# Patient Record
Sex: Female | Born: 1970 | Race: White | Hispanic: No | Marital: Married | Smoking: Never smoker
Health system: Southern US, Community
[De-identification: ages and names within clinical notes are randomized; demographics above are authoritative.]

## PROBLEM LIST (undated history)

## (undated) DIAGNOSIS — S060X9A Concussion with loss of consciousness of unspecified duration, initial encounter: Secondary | ICD-10-CM

## (undated) DIAGNOSIS — E039 Hypothyroidism, unspecified: Secondary | ICD-10-CM

## (undated) DIAGNOSIS — J449 Chronic obstructive pulmonary disease, unspecified: Secondary | ICD-10-CM

## (undated) DIAGNOSIS — F419 Anxiety disorder, unspecified: Secondary | ICD-10-CM

## (undated) DIAGNOSIS — Z8619 Personal history of other infectious and parasitic diseases: Secondary | ICD-10-CM

## (undated) DIAGNOSIS — M1990 Unspecified osteoarthritis, unspecified site: Secondary | ICD-10-CM

## (undated) DIAGNOSIS — R55 Syncope and collapse: Secondary | ICD-10-CM

## (undated) DIAGNOSIS — E559 Vitamin D deficiency, unspecified: Secondary | ICD-10-CM

## (undated) DIAGNOSIS — E663 Overweight: Secondary | ICD-10-CM

## (undated) DIAGNOSIS — R05 Cough: Secondary | ICD-10-CM

## (undated) DIAGNOSIS — E538 Deficiency of other specified B group vitamins: Secondary | ICD-10-CM

## (undated) DIAGNOSIS — J45909 Unspecified asthma, uncomplicated: Secondary | ICD-10-CM

## (undated) HISTORY — DX: Chronic obstructive pulmonary disease, unspecified: J44.9

## (undated) HISTORY — DX: Hypothyroidism, unspecified: E03.9

## (undated) HISTORY — PX: COSMETIC SURGERY: SHX468

## (undated) HISTORY — DX: Syncope and collapse: R55

## (undated) HISTORY — PX: JOINT REPLACEMENT: SHX530

## (undated) HISTORY — DX: Personal history of other infectious and parasitic diseases: Z86.19

## (undated) HISTORY — DX: Concussion with loss of consciousness of unspecified duration, initial encounter: S06.0X9A

## (undated) HISTORY — DX: Unspecified osteoarthritis, unspecified site: M19.90

## (undated) HISTORY — DX: Deficiency of other specified B group vitamins: E53.8

## (undated) HISTORY — DX: Cough: R05

## (undated) HISTORY — DX: Anxiety disorder, unspecified: F41.9

## (undated) HISTORY — DX: Vitamin D deficiency, unspecified: E55.9

## (undated) HISTORY — DX: Unspecified asthma, uncomplicated: J45.909

## (undated) HISTORY — DX: Overweight: E66.3

---

## 1998-05-11 ENCOUNTER — Ambulatory Visit (HOSPITAL_BASED_OUTPATIENT_CLINIC_OR_DEPARTMENT_OTHER): Admission: RE | Admit: 1998-05-11 | Discharge: 1998-05-11 | Payer: Self-pay | Admitting: General Surgery

## 1998-10-31 ENCOUNTER — Inpatient Hospital Stay (HOSPITAL_COMMUNITY): Admission: AD | Admit: 1998-10-31 | Discharge: 1998-10-31 | Payer: Self-pay | Admitting: Obstetrics and Gynecology

## 1998-11-02 ENCOUNTER — Ambulatory Visit (HOSPITAL_COMMUNITY): Admission: RE | Admit: 1998-11-02 | Discharge: 1998-11-02 | Payer: Self-pay | Admitting: Obstetrics & Gynecology

## 1998-11-17 HISTORY — PX: RHINOPLASTY: SUR1284

## 2000-10-13 ENCOUNTER — Other Ambulatory Visit: Admission: RE | Admit: 2000-10-13 | Discharge: 2000-10-13 | Payer: Self-pay | Admitting: Obstetrics & Gynecology

## 2000-11-17 HISTORY — PX: BREAST BIOPSY: SHX20

## 2001-11-17 DIAGNOSIS — R55 Syncope and collapse: Secondary | ICD-10-CM

## 2001-11-17 HISTORY — DX: Syncope and collapse: R55

## 2002-08-09 ENCOUNTER — Other Ambulatory Visit: Admission: RE | Admit: 2002-08-09 | Discharge: 2002-08-09 | Payer: Self-pay | Admitting: Obstetrics & Gynecology

## 2003-06-20 ENCOUNTER — Ambulatory Visit (HOSPITAL_COMMUNITY): Admission: RE | Admit: 2003-06-20 | Discharge: 2003-06-20 | Payer: Self-pay | Admitting: Obstetrics & Gynecology

## 2003-08-24 ENCOUNTER — Encounter: Payer: Self-pay | Admitting: Obstetrics and Gynecology

## 2003-08-24 ENCOUNTER — Ambulatory Visit (HOSPITAL_COMMUNITY): Admission: RE | Admit: 2003-08-24 | Discharge: 2003-08-24 | Payer: Self-pay | Admitting: Obstetrics and Gynecology

## 2003-08-29 ENCOUNTER — Inpatient Hospital Stay (HOSPITAL_COMMUNITY): Admission: AD | Admit: 2003-08-29 | Discharge: 2003-08-31 | Payer: Self-pay | Admitting: Obstetrics and Gynecology

## 2003-10-27 ENCOUNTER — Other Ambulatory Visit: Admission: RE | Admit: 2003-10-27 | Discharge: 2003-10-27 | Payer: Self-pay | Admitting: Obstetrics & Gynecology

## 2004-01-08 ENCOUNTER — Ambulatory Visit (HOSPITAL_COMMUNITY): Admission: RE | Admit: 2004-01-08 | Discharge: 2004-01-08 | Payer: Self-pay | Admitting: Family Medicine

## 2006-02-11 ENCOUNTER — Other Ambulatory Visit: Admission: RE | Admit: 2006-02-11 | Discharge: 2006-02-11 | Payer: Self-pay | Admitting: Obstetrics & Gynecology

## 2010-10-02 ENCOUNTER — Encounter: Admission: RE | Admit: 2010-10-02 | Discharge: 2010-10-02 | Payer: Self-pay | Admitting: Obstetrics & Gynecology

## 2010-11-17 DIAGNOSIS — R053 Chronic cough: Secondary | ICD-10-CM

## 2010-11-17 HISTORY — DX: Chronic cough: R05.3

## 2011-09-01 ENCOUNTER — Encounter: Payer: Self-pay | Admitting: Family Medicine

## 2011-09-01 ENCOUNTER — Ambulatory Visit (INDEPENDENT_AMBULATORY_CARE_PROVIDER_SITE_OTHER): Payer: Commercial Managed Care - PPO | Admitting: Family Medicine

## 2011-09-01 VITALS — BP 122/84 | HR 78 | Temp 98.5°F | Ht 66.0 in | Wt 181.2 lb

## 2011-09-01 DIAGNOSIS — E669 Obesity, unspecified: Secondary | ICD-10-CM | POA: Insufficient documentation

## 2011-09-01 DIAGNOSIS — E663 Overweight: Secondary | ICD-10-CM | POA: Insufficient documentation

## 2011-09-01 DIAGNOSIS — Z23 Encounter for immunization: Secondary | ICD-10-CM

## 2011-09-01 DIAGNOSIS — R05 Cough: Secondary | ICD-10-CM

## 2011-09-01 DIAGNOSIS — R635 Abnormal weight gain: Secondary | ICD-10-CM

## 2011-09-01 MED ORDER — PREDNISONE 20 MG PO TABS: ORAL_TABLET | ORAL | Status: DC

## 2011-09-01 MED ORDER — HYDROCODONE-HOMATROPINE 5-1.5 MG/5ML PO SYRP: 5.0000 mL | ORAL_SOLUTION | Freq: Every evening | ORAL | Status: AC | PRN

## 2011-09-01 MED ORDER — ALBUTEROL 90 MCG/ACT IN AERS: 2.0000 | INHALATION_SPRAY | Freq: Four times a day (QID) | RESPIRATORY_TRACT | Status: DC | PRN

## 2011-09-01 NOTE — Progress Notes (Signed)
Subjective:    Patient ID: Lindsey Proctor, female    DOB: Jun 15, 1971, 40 y.o.   MRN: [id number]  HPI CC: new pt, establish  Wanted new primary care doc for insurance purposes.  Previously seen at urgent medical (prior saw [facility] Gilbert [facility] in [place]).  Persistent cough - once a year gets acute bronchitis that lasts >1 month.  Has been going on for 8 wks.  This started as head cold.  Has come earlier this year, usually tends to come in winter.  In past given albuterol treatment, shot of prednisone (which helps within minutes), given inhaler to take home.  This year cough started, some different symptoms.  Has completely lost sense of taste.  Throat feels swollen and neck muscles tight - cold compresses help.  SOB different this time, comes on with cough, inhaler (albuterol) not really helping.  Cough worse at night.  Dry cough.  Had clear CXR last year.  Cough syrup does help (hydrocodone).  No asthma dx.  No RN, head congestion, fevers/chills, abd pain, n/v, rashes. No h/o heartburn, GERD sxs.  No leg swelling, PNdyspnea.  No chest pain. Notes sensitive to smells like tar - trouble breathing.  Recently increased stress last 6 mo - change in jobs (lost prior job).  Has peak flow at home.  Has never had PFTs done.  In last 6 mo gained 25lbs.  Preventative: Tdap 05/2011 No flu shot yet.  Would like today. Last CPE 09/2010, with blood work.  Well woman at [facility], always normal pap smears.  No recent thyroid check. mammo 2011.  Medications and allergies reviewed and updated in chart.  Past histories reviewed and updated if relevant as below. There is no problem list on file for this patient.  Past Medical History  Diagnosis Date  . Fainting spell 2003  . History of chicken pox    Past Surgical History  Procedure Date  . Rhinoplasty 2000    for deviated septum  . Breast biopsy 2002   History  Substance Use Topics  . Smoking status: Never Smoker     . Smokeless tobacco: Never Used  . Alcohol Use: No   Family History  Problem Relation Age of Onset  . Diabetes Father   . Coronary artery disease Neg Hx   . Cancer Neg Hx   . Stroke Neg Hx   . Hypertension Neg Hx    No Known Allergies Current Outpatient Prescriptions on File Prior to Visit  Medication Sig Dispense Refill  . levonorgestrel (MIRENA) 20 MCG/24HR IUD 1 each by Intrauterine route once.         Review of Systems  Constitutional: Positive for unexpected weight change. Negative for fever, chills, activity change, appetite change and fatigue.  HENT: Negative for hearing loss and neck pain.   Eyes: Negative for visual disturbance.  Respiratory: Positive for cough and shortness of breath. Negative for chest tightness and wheezing.   Cardiovascular: Negative for chest pain, palpitations and leg swelling.  Gastrointestinal: Negative for nausea, vomiting, abdominal pain, diarrhea, constipation, blood in stool and abdominal distention.  Genitourinary: Negative for hematuria and difficulty urinating.  Musculoskeletal: Negative for myalgias and arthralgias.  Skin: Negative for rash.  Neurological: Negative for dizziness, seizures, syncope and headaches.  Hematological: Does not bruise/bleed easily.  Psychiatric/Behavioral: Negative for dysphoric mood. The patient is not nervous/anxious.       Objective:   Physical Exam  Nursing note and vitals reviewed. Constitutional: She is  oriented to person, place, and time. She appears well-developed and well-nourished. No distress.  HENT:  Head: Normocephalic and atraumatic.  Right Ear: External ear normal.  Left Ear: External ear normal.  Nose: Nose normal.  Mouth/Throat: Oropharynx is clear and moist. No oropharyngeal exudate.  Eyes: Conjunctivae and EOM are normal. Pupils are equal, round, and reactive to light. No scleral icterus.  Neck: Normal range of motion. Neck supple. No thyromegaly present.  Cardiovascular: Normal rate,  regular rhythm, normal heart sounds and intact distal pulses.   No murmur heard. Pulses:      Radial pulses are 2+ on the right side, and 2+ on the left side.  Pulmonary/Chest: Effort normal and breath sounds normal. No respiratory distress. She has no wheezes. She has no rales.  Abdominal: Soft. Bowel sounds are normal. She exhibits no distension and no mass. There is no tenderness. There is no rebound and no guarding.  Musculoskeletal: Normal range of motion.  Lymphadenopathy:    She has no cervical adenopathy.  Neurological: She is alert and oriented to person, place, and time.       CN grossly intact, station and gait intact  Skin: Skin is warm and dry. No rash noted.  Psychiatric: She has a normal mood and affect. Her behavior is normal. Judgment and thought content normal.      Assessment & Plan:

## 2011-09-01 NOTE — Assessment & Plan Note (Signed)
Review records, will want to obtain TSH next blood draw. Pt to come for blood work when fasting and afterwards for CPE.

## 2011-09-01 NOTE — Assessment & Plan Note (Signed)
Anticipate RAD component, going on 2+ months.  Continue albuterol and cough syrup. No evidence of bacterial infection currently, no need for abx. Start short course of steroids as states has responded well to steroids in past for throat tightness. Given having recurrent prolonged episodes of bronchitis yearly for last several years, have asked her to return when fasting for pre and post spirometry.  Significant second hand smoke exposure when younger. Have requested records from recent [facility]. Flu shot today.

## 2011-09-01 NOTE — Patient Instructions (Addendum)
For cough - continue albuterol as needed.  Continue hydrocodone cough syrup.  I will place you on short course of steroids to see if we can help the cough. Return in 2-3 months for spirometry pre and post albuterol. Good to see you today, call us with questions. I will request records from urgent care. Return at your convenience fasting for blood works, afterwards for CPE. Flu shot today.

## 2011-09-03 ENCOUNTER — Telehealth: Payer: Self-pay | Admitting: *Deleted

## 2011-09-03 NOTE — Telephone Encounter (Signed)
Pt states hycodan was to have been called to the pharmacy at her last office visit but it wasn't.  I checked with [facility] and they didn't have it.  I called it in to them.

## 2011-09-03 NOTE — Telephone Encounter (Signed)
Thank you :)

## 2011-09-24 ENCOUNTER — Telehealth: Payer: Self-pay | Admitting: *Deleted

## 2011-09-24 NOTE — Telephone Encounter (Signed)
Pt needs to have a physical done before the end of the year so that her insurance will cover it.  She has one scheduled but you had told her that she needs to have spirometry done at that visit.  She is concerned that her insurance wont pay for that, since that wouldn't fall under a wellness screening.  She is asking if she should postpone the spirometry.  Please advise.

## 2011-09-24 NOTE — Telephone Encounter (Signed)
Ok to do physical first, then spirometry at her covenience.

## 2011-09-25 NOTE — Telephone Encounter (Signed)
Patient notified

## 2011-11-07 ENCOUNTER — Encounter: Payer: Self-pay | Admitting: Family Medicine

## 2011-11-07 ENCOUNTER — Ambulatory Visit (INDEPENDENT_AMBULATORY_CARE_PROVIDER_SITE_OTHER): Payer: Commercial Managed Care - PPO | Admitting: Family Medicine

## 2011-11-07 VITALS — BP 116/78 | HR 72 | Temp 98.0°F | Wt 182.2 lb

## 2011-11-07 DIAGNOSIS — Z0000 Encounter for general adult medical examination without abnormal findings: Secondary | ICD-10-CM | POA: Insufficient documentation

## 2011-11-07 LAB — COMPREHENSIVE METABOLIC PANEL
ALT: 20 U/L (ref 0–35)
AST: 21 U/L (ref 0–37)
Albumin: 3.9 g/dL (ref 3.5–5.2)
CO2: 25 mEq/L (ref 19–32)
Calcium: 9 mg/dL (ref 8.4–10.5)
Chloride: 108 mEq/L (ref 96–112)
Creatinine, Ser: 0.7 mg/dL (ref 0.4–1.2)
GFR: 97.91 mL/min (ref >60.00)
Potassium: 4.2 mEq/L (ref 3.5–5.1)
Total Protein: 7.1 g/dL (ref 6.0–8.3)

## 2011-11-07 LAB — CBC WITH DIFFERENTIAL/PLATELET
Basophils Absolute: 0 10*3/uL (ref 0.0–0.1)
Eosinophils Absolute: 0 10*3/uL (ref 0.0–0.7)
Lymphocytes Relative: 25.2 % (ref 12.0–46.0)
MCHC: 34 g/dL (ref 30.0–36.0)
MCV: 89.7 fl (ref 78.0–100.0)
Monocytes Absolute: 0.6 10*3/uL (ref 0.1–1.0)
Neutrophils Relative %: 64.2 % (ref 43.0–77.0)
Platelets: 181 10*3/uL (ref 150.0–400.0)
RDW: 13.4 % (ref 11.5–14.6)

## 2011-11-07 LAB — LIPID PANEL
Cholesterol: 215 mg/dL — ABNORMAL HIGH (ref 0–200)
Total CHOL/HDL Ratio: 5

## 2011-11-07 NOTE — Patient Instructions (Addendum)
Blood work today. Return at your convenience for spirometry.  Try to get most or all of your calcium from your food--aim for 1000 mg/day for women up to 50 and men up to 70 and 1200 mg/day for women over 50 and men over 70.  To figure out dietary calcium: 300 mg/day from all non dairy foods plus 300 mg per cup of milk, other dairy, or fortified juice.  Non dairy foods that contain calcium: Kale, oranges, sardines, oatmeal, soy milk/soybeans, salmon, white beans, dried figs, turnip greens, almonds, broccoli, tofu.  Increase dietary vitamin D intake - goal 600-800IU daily.  That means more fish - mackerel, tuna, salmon, herring, sardines, catfish, cod liver oil, and eggs.  And small doses of sunshine throughout the day.  Good to see you , call us with questions.

## 2011-11-07 NOTE — Progress Notes (Signed)
Subjective:    Patient ID: Lindsey Proctor, female    DOB: 1970/11/29, 40 y.o.   MRN: [id number]  HPI CC: CPE  Here for CPE today.  Yearly bronchitis that lasts 2 mo to resolve.  Significant smoke exposure growing up.  Always responsive to steroids.  Will set up with spirometry to eval copd, asthma.  No allergy sxs, RN congestion, sneezing, itchy eyes.  Wants to return for this.  Feels hot at night - over last month having occasional episodes where awakens too hot and has to throw covers off.  Endorses weight gain over last 1 year.  Decreased appetite.  New job since June 2012.  Feels normal weight is 140 lbs.  Wt Readings from Last 3 Encounters:  11/07/11 182 lb 4 oz (82.668 kg)  09/01/11 181 lb 4 oz (82.214 kg)   Preventative:  Tdap 05/2011  Flu 2012 Last CPE 09/2010, with blood work. Well woman at [facility], always normal pap smears. mammo 09/2010 with fibroid cyst.  To repeat in 09/2012. Low calcium in diet. Fasting today. Seat belt 100% time.  Medications and allergies reviewed and updated in chart.  Past histories reviewed and updated if relevant as below. Patient Active Problem List  Diagnoses  . Cough  . Weight gain   Past Medical History  Diagnosis Date  . Fainting spell 2003  . History of chicken pox    Past Surgical History  Procedure Date  . Rhinoplasty 2000    for deviated septum  . Breast biopsy 2002   History  Substance Use Topics  . Smoking status: Never Smoker   . Smokeless tobacco: Never Used  . Alcohol Use: No   Family History  Problem Relation Age of Onset  . Diabetes Father   . Coronary artery disease Neg Hx   . Cancer Neg Hx   . Stroke Neg Hx   . Hypertension Neg Hx    No Known Allergies Current Outpatient Prescriptions on File Prior to Visit  Medication Sig Dispense Refill  . acetaminophen (TYLENOL) 325 MG tablet Take 650 mg by mouth every 6 (six) hours as needed.        Marland Kitchen ibuprofen (ADVIL,MOTRIN) 200 MG tablet Take 200 mg by mouth  every 6 (six) hours as needed.        Marland Kitchen levonorgestrel (MIRENA) 20 MCG/24HR IUD 1 each by Intrauterine route once.        Marland Kitchen albuterol (PROVENTIL,VENTOLIN) 90 MCG/ACT inhaler Inhale 2 puffs into the lungs every 6 (six) hours as needed for wheezing.  17 g  3   Review of Systems  Constitutional: Negative for fever, chills, activity change, appetite change, fatigue and unexpected weight change.  HENT: Negative for hearing loss and neck pain.   Eyes: Negative for visual disturbance.  Respiratory: Negative for cough, chest tightness, shortness of breath and wheezing.   Cardiovascular: Negative for chest pain, palpitations and leg swelling.  Gastrointestinal: Negative for nausea, vomiting, abdominal pain, diarrhea, constipation, blood in stool and abdominal distention.  Genitourinary: Negative for hematuria and difficulty urinating.  Musculoskeletal: Negative for myalgias and arthralgias.  Skin: Negative for rash.  Neurological: Negative for dizziness, seizures, syncope and headaches.  Hematological: Does not bruise/bleed easily.  Psychiatric/Behavioral: Negative for dysphoric mood. The patient is not nervous/anxious.        Objective:   Physical Exam  Nursing note and vitals reviewed. Constitutional: She is oriented to person, place, and time. She appears well-developed and well-nourished. No distress.  HENT:  Head: Normocephalic  and atraumatic.  Right Ear: External ear normal.  Left Ear: External ear normal.  Nose: Nose normal.  Mouth/Throat: Oropharynx is clear and moist. No oropharyngeal exudate.  Eyes: Conjunctivae and EOM are normal. Pupils are equal, round, and reactive to light. No scleral icterus.  Neck: Normal range of motion. Neck supple. No thyromegaly present.  Cardiovascular: Normal rate, regular rhythm, normal heart sounds and intact distal pulses.   No murmur heard. Pulses:      Radial pulses are 2+ on the right side, and 2+ on the left side.  Pulmonary/Chest: Effort  normal and breath sounds normal. No respiratory distress. She has no wheezes. She has no rales.  Abdominal: Soft. Bowel sounds are normal. She exhibits no distension and no mass. There is no tenderness. There is no rebound and no guarding.  Musculoskeletal: Normal range of motion.  Lymphadenopathy:    She has no cervical adenopathy.  Neurological: She is alert and oriented to person, place, and time.       CN grossly intact, station and gait intact  Skin: Skin is warm and dry. No rash noted.  Psychiatric: She has a normal mood and affect. Her behavior is normal. Judgment and thought content normal.       Assessment & Plan:

## 2011-11-07 NOTE — Assessment & Plan Note (Signed)
Preventative protocols reviewed and updated. UTD tetanus, flu, pap, mammo.  Due for rpt mammo 09/2012. Well woman at [facility].  Fasting blood work today.

## 2012-01-28 ENCOUNTER — Encounter: Payer: Self-pay | Admitting: Family Medicine

## 2012-01-28 ENCOUNTER — Ambulatory Visit (INDEPENDENT_AMBULATORY_CARE_PROVIDER_SITE_OTHER): Payer: Commercial Managed Care - PPO | Admitting: Family Medicine

## 2012-01-28 VITALS — BP 110/78 | HR 76 | Temp 98.4°F | Ht 64.0 in | Wt 173.2 lb

## 2012-01-28 DIAGNOSIS — R05 Cough: Secondary | ICD-10-CM

## 2012-01-28 DIAGNOSIS — R059 Cough, unspecified: Secondary | ICD-10-CM

## 2012-01-28 MED ORDER — ALBUTEROL SULFATE (2.5 MG/3ML) 0.083% IN NEBU
2.5000 mg | INHALATION_SOLUTION | Freq: Once | RESPIRATORY_TRACT | Status: AC
  Administered 2012-01-28: 2.5 mg via RESPIRATORY_TRACT

## 2012-01-28 NOTE — Progress Notes (Signed)
  Subjective:    Patient ID: Lindsey Proctor, female    DOB: October 31, 1971, 41 y.o.   MRN: [id number]  HPI CC: f/u cough  Cough resolved.  Lasted 8 wks, finally subsided in December.  Has done well for last 3 months.    Has not had PFTs or CXR in past.  Once or twice yearly bronchitis that lasts 2 mo to resolve (in winter and again in spring). Significant smoke exposure growing up. Always responsive to steroids. Will set up with spirometry to eval copd, asthma (including cough-variant).   Denies fevers/chills, RN, sneezing, indigestion, heartburn, PNDrainage, watery eyes.  Husband and daughter with head colds recently, pt worried cough may return.  Medications and allergies reviewed and updated in chart.  Past histories reviewed and updated if relevant as below. Patient Active Problem List  Diagnoses  . Cough  . Weight gain  . Healthcare maintenance   Past Medical History  Diagnosis Date  . Fainting spell 2003  . History of chicken pox    Past Surgical History  Procedure Date  . Rhinoplasty 2000    for deviated septum  . Breast biopsy 2002   History  Substance Use Topics  . Smoking status: Never Smoker   . Smokeless tobacco: Never Used  . Alcohol Use: No   Family History  Problem Relation Age of Onset  . Diabetes Father   . Coronary artery disease Neg Hx   . Cancer Neg Hx   . Stroke Neg Hx   . Hypertension Neg Hx    No Known Allergies Current Outpatient Prescriptions on File Prior to Visit  Medication Sig Dispense Refill  . acetaminophen (TYLENOL) 325 MG tablet Take 650 mg by mouth every 6 (six) hours as needed.        Marland Kitchen ibuprofen (ADVIL,MOTRIN) 200 MG tablet Take 200 mg by mouth every 6 (six) hours as needed.        Marland Kitchen levonorgestrel (MIRENA) 20 MCG/24HR IUD 1 each by Intrauterine route once.        Marland Kitchen albuterol (PROVENTIL,VENTOLIN) 90 MCG/ACT inhaler Inhale 2 puffs into the lungs every 6 (six) hours as needed for wheezing.  17 g  3    Body mass index is 29.74  kg/(m^2).  Review of Systems Per HPI    Objective:   Physical Exam  Nursing note and vitals reviewed. Constitutional: She appears well-developed and well-nourished. No distress.  HENT:  Head: Normocephalic and atraumatic.  Mouth/Throat: Oropharynx is clear and moist. No oropharyngeal exudate.  Eyes: Conjunctivae and EOM are normal. Pupils are equal, round, and reactive to light. No scleral icterus.  Neck: Normal range of motion. Neck supple.  Cardiovascular: Normal rate, regular rhythm, normal heart sounds and intact distal pulses.   No murmur heard. Pulmonary/Chest: Effort normal and breath sounds normal. No respiratory distress. She has no wheezes. She has no rales.       Slight decreased breath sounds bibasilarly  Musculoskeletal: She exhibits no edema.  Skin: Skin is warm and dry. No rash noted.  Psychiatric: She has a normal mood and affect.       Assessment & Plan:

## 2012-01-28 NOTE — Assessment & Plan Note (Addendum)
Previously thought RAD component as improved with steroids.   Significant second hand smoke exposure when younger. Check spirometry pre/post today --> normal.  5% increase in FEV1 with albuterol. Predicted FEV1 = 2.94.   Predicted PEF = ~400 for age and height Monitor for return of bronchitis.

## 2012-01-28 NOTE — Patient Instructions (Addendum)
Good to see you today.  Call us with questions. Spirometry showing great lung function!  (pointing against significant asthma or any COPD component) No changes needed today - continue albuterol as needed.

## 2012-04-29 ENCOUNTER — Telehealth: Payer: Self-pay

## 2012-04-29 NOTE — Telephone Encounter (Signed)
Patient notified and sample placed up front for pick up.

## 2012-04-29 NOTE — Telephone Encounter (Signed)
Pt has hx of cough that last several weeks; pt said Dr Sharen Hones wanted to know at onset of next cough episode; began 04/26/12 with non productive cough, 99 temp on 04/26/12, no fever since, no wheezing, no trouble breathing; pt using inhaler. No S/T, head congestion or runny nose. Pt request med to avoid several weeks of coughing.Please advise.[facility].

## 2012-04-29 NOTE — Telephone Encounter (Signed)
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  I'd like [place] to try inhaled corticosteroids.  May come in to pick up flovent 2 puffs bid.  Do this daily for next week and update me with progress of cough.

## 2012-04-29 NOTE — Telephone Encounter (Signed)
Message left for patient to call me back to advise how to use sample meds.

## 2012-05-03 ENCOUNTER — Ambulatory Visit (INDEPENDENT_AMBULATORY_CARE_PROVIDER_SITE_OTHER): Payer: Commercial Managed Care - PPO | Admitting: Family Medicine

## 2012-05-03 ENCOUNTER — Telehealth: Payer: Self-pay | Admitting: Family Medicine

## 2012-05-03 ENCOUNTER — Encounter: Payer: Self-pay | Admitting: Family Medicine

## 2012-05-03 VITALS — BP 112/80 | HR 89 | Temp 98.6°F | Wt 173.8 lb

## 2012-05-03 DIAGNOSIS — R05 Cough: Secondary | ICD-10-CM

## 2012-05-03 MED ORDER — AMOXICILLIN 875 MG PO TABS: 875.0000 mg | ORAL_TABLET | Freq: Two times a day (BID) | ORAL | Status: AC

## 2012-05-03 NOTE — Telephone Encounter (Signed)
See if she can be here today at 4:30.  If not, then go to UC or get on schedule tomorrow.  Thanks.

## 2012-05-03 NOTE — Telephone Encounter (Signed)
Caller: Kip/Patient; PCP: Eustaquio Boyden;  Call regarding Persistant nonproductive  Cough- possibly triggered by Allergies/Sinus- lastest flair up onset 04/26/12. She had low grade fever for a few days when it started and daughter sick. Spoke with Selena Batten, RN in the office on 04/29/12 and has been using Flovent 2 puffs twice daily since 04/30/12 and not really helping reduce cough.  Uses Albuterol Inhaler prn. She takes Hydrocodone at night but is still up at night coughing. Seen in UC in the past for same cough and chest Xray negative for Bronchitis ans Pneumonia. Treated with Prednisone and Inhaler back in Nov 2012- didnt really help. She is getting flairup for 6 weeks at a time- mainly in fall. Feels drainage in throat and since on Flovent she has coughed up small amount whitish mucos. She has coughing spasms to the point of vomiting and sides hurt/chest burns. Nose bleeds with blowing. She got sent home from work today d/t cough-05/03/12. Triage and Care advice per Cough Protocol and APPNT ADVISED WITHIN 4 HOURS. PLEASE CALL BACK TO LET HER KNOW IF SHE CAN BE SEEN IN OFFICE TODAY- OR IF SHE SHOULD GO TO UC OR WAIT TO BE SEEN ON 05/04/12. CB#: [phone]

## 2012-05-03 NOTE — Progress Notes (Signed)
Cough started last week.  She had an episode last fall that lasted 6 weeks.  Deep breath triggers the cough.  Small amount of white sputum.  Some post nasal gtt.  Voice changed, was hoarse recently, some better now.  Recently started on flovent; SABA doesn't help much with the cough.  She'll have coughing fits and will eventually vomit/dry heave.  No fevers, tmax 99 last week.  No h/o asthma.  Nonsmoker.  Normal spirometry recently done.  Hydrocodone wasn't helping the cough much.  Tessalon doesn't help.  Never was on advair.  Some nasal pressure and occ blood in rhinorrhea when blowing nose.   This has happened mult times prev, but is usually in the fall and not the spring.    Meds, vitals, and allergies reviewed.   ROS: See HPI.  Otherwise, noncontributory.  nad ncat Tm wnl Nose with small bleed noted, clotted, in R medial nostril. Purulent discharge noted Op with cobblestoning Neck supple, no LA rrr Ctab, no wheeze

## 2012-05-03 NOTE — Telephone Encounter (Signed)
Scheduled today at 430pm

## 2012-05-03 NOTE — Patient Instructions (Signed)
Keep using your regular meds and start the amoxil today.  Use nasal saline several times a day.  I'll notify Dr. Reece Agar.  Let him know if you aren't improved.  Take care.

## 2012-05-04 NOTE — Assessment & Plan Note (Signed)
Possibly from postnasal gtt.  Continue inhalers for now, hasn't had time to have full effect.  Start amoxil and nasal saline in meantime and I'll notify PCP re: status.  She agrees.  Nontoxic and lungs clear.

## 2012-05-07 ENCOUNTER — Telehealth: Payer: Self-pay | Admitting: Family Medicine

## 2012-05-07 DIAGNOSIS — J411 Mucopurulent chronic bronchitis: Secondary | ICD-10-CM

## 2012-05-07 MED ORDER — HYDROCOD POLST-CHLORPHEN POLST 10-8 MG/5ML PO LQCR: 5.0000 mL | Freq: Every evening | ORAL | Status: DC | PRN

## 2012-05-07 NOTE — Telephone Encounter (Signed)
Lets have her try advair inhaler, may come in and pick up sample - take 2 puffs bid of 100/50.  Stop flovent. If not better, could offer referral to pulmonology for recurrent prolonged bronchitis and anticipated reactive airway disease.

## 2012-05-07 NOTE — Telephone Encounter (Signed)
Caller: Amma/Patient; Phone Number: [phone]; Message from caller: Patient following up from visit Monday and wants to know if Dr Para March has had a chance to discuss visit with Dr Patrice Paradise.  Also would like to know what further action needs to be taken since abx does not seem to be helping cough.

## 2012-05-07 NOTE — Telephone Encounter (Signed)
Will send in tussionex - a bit stronger.  plz phone in and notify pt. Placed pulm referral in chart.

## 2012-05-07 NOTE — Telephone Encounter (Signed)
Spoke with patient. Placed advair sample up front for pick up. She asked if she could possibly get a refill on the cough syrup she got back in October because it seems to be the only thing that will keep the cough slightly at bay long enough for her to work or sleep. She also wants the pulmonology referral and prefers to go to [facility] available. She asked about maybe taking singulair as a preventative once things calm down. She said Dr. Para March had mentioned it. She will await call from [facility].

## 2012-05-07 NOTE — Telephone Encounter (Signed)
Noted, app input from PCP.

## 2012-05-07 NOTE — Telephone Encounter (Signed)
Medication phoned to pharmacy. Patient advised.  

## 2012-05-07 NOTE — Telephone Encounter (Signed)
We had discussed, will forward to Dr. Reece Agar.  Please discuss with me again today.

## 2012-06-15 ENCOUNTER — Ambulatory Visit (INDEPENDENT_AMBULATORY_CARE_PROVIDER_SITE_OTHER)
Admission: RE | Admit: 2012-06-15 | Discharge: 2012-06-15 | Disposition: A | Discharge status: Home / Self Care | Payer: Commercial Managed Care - PPO | Source: Ambulatory Visit | Attending: Pulmonary Disease | Admitting: Pulmonary Disease

## 2012-06-15 ENCOUNTER — Encounter: Payer: Self-pay | Admitting: Pulmonary Disease

## 2012-06-15 ENCOUNTER — Ambulatory Visit (INDEPENDENT_AMBULATORY_CARE_PROVIDER_SITE_OTHER): Payer: Commercial Managed Care - PPO | Admitting: Pulmonary Disease

## 2012-06-15 VITALS — BP 130/76 | HR 97 | Temp 98.2°F | Ht 64.0 in | Wt 169.8 lb

## 2012-06-15 DIAGNOSIS — R05 Cough: Secondary | ICD-10-CM

## 2012-06-15 MED ORDER — FLUTICASONE PROPIONATE 50 MCG/ACT NA SUSP: 2.0000 | Freq: Every day | NASAL | Status: DC

## 2012-06-15 MED ORDER — MONTELUKAST SODIUM 10 MG PO TABS: 10.0000 mg | ORAL_TABLET | Freq: Every day | ORAL | Status: DC

## 2012-06-15 NOTE — Assessment & Plan Note (Addendum)
She has persistent cough for several months.  She does have history of second hand tobacco exposure.  She has symptoms suggestive of post-nasal drip which is likely triggering her cough.  To further assess will arrange for chest xray and full PFT's.  I have advised her to use nasal irrigation and flonase on a daily basis until next visit.  She noted some improvement with advair, but this may have also caused some throat irritation.  I have advised her to hold off on continuing advair for now.  She can use albuterol prn.  I will start her on singulair; this should have dual benefit for her sinuses and possible asthma.  I explained that it can take several weeks with therapy to determine if there is improvement.    Of note is that she did have previous sinus surgery, and may need f/u with ENT (Dr. Haroldine Laws) if her sinus symptoms persist.

## 2012-06-15 NOTE — Patient Instructions (Signed)
Chest xray today>>will call with results Will arrange for breathing test (PFT) Nasal irrigation (saline sinus rinse) daily Flonase two sprays in each nostril daily Montelukast (singulair) 10 mg pill>>take one pill nightly before bedtime Use sugarless candy to keep mouth moist Sip water when you have urge to cough Salt water gargles as needed Albuterol inhaler two puffs as needed for cough, wheeze, or chest congestion Follow up in 3 to 4 weeks

## 2012-06-15 NOTE — Progress Notes (Deleted)
  Subjective:    Patient ID: Lindsey Proctor, female    DOB: February 19, 1971, 41 y.o.   MRN: [id number]  HPI    Review of Systems  Constitutional: Negative for fever, appetite change and unexpected weight change.  HENT: Positive for congestion and trouble swallowing. Negative for ear pain, sore throat and sneezing.   Respiratory: Positive for cough. Negative for shortness of breath.   Cardiovascular: Negative for chest pain, palpitations and leg swelling.  Gastrointestinal: Negative for abdominal pain.  Musculoskeletal: Negative for joint swelling.  Skin: Negative for rash.  Neurological: Negative for headaches.  Psychiatric/Behavioral: Negative for dysphoric mood. The patient is not nervous/anxious.        Objective:   Physical Exam        Assessment & Plan:

## 2012-06-15 NOTE — Progress Notes (Signed)
Chief Complaint  Patient presents with  . Advice Only    refer for reccurent bronchitis. Pt c/o dry cough and feels like something is stuck in her throat    History of Present Illness: Lindsey Proctor is a 41 y.o. female for evaluation of chronic cough.  She typically gets a cough with bronchitis once per year.  Her current episode started in November 2012, and has been present intermittently since then.  Her cough become more persistent in May 2013.  She denies allergies, or eye irritation.  She denies sputum production.  She does get a globus sensation.  She gets post-nasal drip all the time.  She denies hoarseness.  She has chronic tinnitus, but otherwise denies ear pain.  She will get a feeling of chest tightness associated with her cough, and this seems to be worse at night.    She denies gland swelling, leg swelling , skin rash, or fever.  She denies heart burn or abdominal pain.  She tried amoxicillin, but this did not help much.  She tried a saline nasal spray for about two weeks.  She tried flovent 44 mcg two puffs bid for about 1.5 weeks, but this didn't seem to help much.  She was then tried on advair, and this may have helped some.  However, she noticed feeling more hoarse when using advair.  She has used albuterol before, and this helped when her symptoms were really bad; however she has not used albuterol recently.  She reports that her cough would get better with prednisone therapy in the past.  She is from [place], and denies any recent travel.  She never smoked, but her parents and siblings did.  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    Deviated septum repair by Dr. Haroldine Laws 2002.  She has not had f/u with him recently.   Past Medical History  Diagnosis Date  . Fainting spell 2003  . History of chicken pox   . Overweight     Past Surgical History  Procedure Date  . Rhinoplasty 2000    for deviated septum  . Breast biopsy 2002     Current Outpatient Prescriptions on File Prior to Visit  Medication Sig Dispense Refill  . acetaminophen (TYLENOL) 325 MG tablet Take 650 mg by mouth every 6 (six) hours as needed.        Marland Kitchen albuterol (PROVENTIL,VENTOLIN) 90 MCG/ACT inhaler Inhale 2 puffs into the lungs every 6 (six) hours as needed for wheezing.  17 g  3  . Calcium Carbonate (CALTRATE 600 PO) Take 1 tablet by mouth daily.      . Fluticasone-Salmeterol (ADVAIR) 100-50 MCG/DOSE AEPB Inhale 1 puff into the lungs every 12 (twelve) hours.      Marland Kitchen HYDROcodone-homatropine (HYCODAN) 5-1.5 MG/5ML syrup Take 5 mLs by mouth every 6 (six) hours as needed.      Marland Kitchen ibuprofen (ADVIL,MOTRIN) 200 MG tablet Take 200 mg by mouth every 6 (six) hours as needed.        Marland Kitchen levonorgestrel (MIRENA) 20 MCG/24HR IUD 1 each by Intrauterine route once.        . Multiple Vitamin (MULTIVITAMIN) tablet Take 1 tablet by mouth daily.        No Known Allergies  Family History  Problem Relation Age of Onset  . Diabetes Father   . Coronary artery disease Neg Hx   . Cancer Neg Hx   . Stroke Neg Hx   .  Hypertension Neg Hx   . Asthma      great great grandmother    History  Substance Use Topics  . Smoking status: Never Smoker   . Smokeless tobacco: Never Used  . Alcohol Use: No    Review of Systems  Constitutional: Negative for fever, appetite change and unexpected weight change.  HENT: Positive for congestion and trouble swallowing. Negative for ear pain, sore throat and sneezing.   Respiratory: Positive for cough. Negative for shortness of breath.   Cardiovascular: Negative for chest pain, palpitations and leg swelling.  Gastrointestinal: Negative for abdominal pain.  Musculoskeletal: Negative for joint swelling.  Skin: Negative for rash.  Neurological: Negative for headaches.  Psychiatric/Behavioral: Negative for dysphoric mood. The patient is not nervous/anxious.    Physical Exam: Filed Vitals:   06/15/12 1001  BP: 130/76  Pulse: 97   Temp: 98.2 F (36.8 C)  TempSrc: Oral  Height: 5\' 4"  (1.626 m)  Weight: 169 lb 12.8 oz (77.021 kg)  SpO2: 98%  ,  Current Encounter SPO2  06/15/12 1001 98%  05/03/12 1622 97%  11/07/11 0845 97%    Wt Readings from Last 3 Encounters:  06/15/12 169 lb 12.8 oz (77.021 kg)  05/03/12 173 lb 12 oz (78.812 kg)  01/28/12 173 lb 4 oz (78.586 kg)    Body mass index is 29.15 kg/(m^2).   General - No distress ENT - TM clear, no sinus tenderness, no oral exudate, no LAN, no thyromegaly Cardiac - s1s2 regular, no murmur, pulses symmetric, no edema Chest - normal respiratory excursion, good air entry, no wheeze/rales/dullness Back - no focal tenderness Abd - soft, non-tender, no organomegaly, + bowel sounds Ext - normal motor strength Neuro - Cranial nerves are normal. PERLA. EOM's intact. Skin - no discernible active dermatitis, erythema, urticaria or inflammatory process. Psych - normal mood, and behavior.   Lab Results  Component Value Date   WBC 5.8 11/07/2011   HGB 12.5 11/07/2011   HCT 36.7 11/07/2011   MCV 89.7 11/07/2011   PLT 181.0 11/07/2011    Lab Results  Component Value Date   CREATININE 0.7 11/07/2011   BUN 17 11/07/2011   NA 140 11/07/2011   K 4.2 11/07/2011   CL 108 11/07/2011   CO2 25 11/07/2011    Lab Results  Component Value Date   ALT 20 11/07/2011   AST 21 11/07/2011   ALKPHOS 57 11/07/2011   BILITOT 0.3 11/07/2011    Lab Results  Component Value Date   TSH 0.79 11/07/2011      Assessment/Plan:  Coralyn Helling, MD [facility] Pager:  [phone] 06/15/2012, 10:03 AM

## 2012-06-16 ENCOUNTER — Telehealth: Payer: Self-pay | Admitting: Pulmonary Disease

## 2012-06-16 NOTE — Telephone Encounter (Signed)
I spoke with patient about results and she verbalized understanding and had no questions 

## 2012-06-16 NOTE — Telephone Encounter (Signed)
Dg Chest 2 View  06/15/2012  *RADIOLOGY REPORT*  Clinical Data: Nonsmoker with chronic cough.  CHEST - 2 VIEW  Comparison: 01/08/2004  Findings: Midline trachea.  Normal heart size and mediastinal contours. No pleural effusion or pneumothorax.  Clear lungs.  IMPRESSION: No acute cardiopulmonary disease.  Original Report Authenticated By: Consuello Bossier, M.D.    Will have my nurse inform pt that CXR was normal.

## 2012-10-05 ENCOUNTER — Ambulatory Visit (INDEPENDENT_AMBULATORY_CARE_PROVIDER_SITE_OTHER): Payer: Commercial Managed Care - PPO | Admitting: Family Medicine

## 2012-10-05 ENCOUNTER — Encounter: Payer: Self-pay | Admitting: Family Medicine

## 2012-10-05 VITALS — BP 126/78 | HR 72 | Temp 98.3°F | Ht 66.0 in | Wt 168.8 lb

## 2012-10-05 DIAGNOSIS — R05 Cough: Secondary | ICD-10-CM

## 2012-10-05 DIAGNOSIS — E785 Hyperlipidemia, unspecified: Secondary | ICD-10-CM | POA: Insufficient documentation

## 2012-10-05 DIAGNOSIS — Z0000 Encounter for general adult medical examination without abnormal findings: Secondary | ICD-10-CM

## 2012-10-05 NOTE — Progress Notes (Signed)
Subjective:    Patient ID: Lindsey Proctor, female    DOB: [birth year], 41 y.o.   MRN: [id number]  HPI CC: CPE  Chronic cough - saw Dr. Craige Cotta.  Placed on singulair which has staved off cough/bronchitis this year (so far).  Very satisfied with results.  Preventative Flu shot at work Tetanus 2012 Well woman exam - OBGYN Dr. Arlyce Dice (09/2011).  Due for rescheduling. Mammogram - 2011.  Due for rpt.  Wt Readings from Last 3 Encounters:  10/05/12 168 lb 12 oz (76.544 kg)  06/15/12 169 lb 12.8 oz (77.021 kg)  05/03/12 173 lb 12 oz (78.812 kg)  mild HLD - trying to maintain with low chol diet.  Caffeine: 3 20oz caffeinated drinks/day (diet mountain dew) Lives with husband, 2 children (son and daughter), 1 dog and cat Occupation: customer service call center Activity: tries to walk regularly at work Diet: fruits and vegetables, lots of water  Medications and allergies reviewed and updated in chart.  Past histories reviewed and updated if relevant as below. Patient Active Problem List  Diagnosis  . Cough  . Overweight  . Healthcare maintenance   Past Medical History  Diagnosis Date  . Fainting spell 2003  . History of chicken pox   . Overweight    Past Surgical History  Procedure Date  . Rhinoplasty 2000    for deviated septum  . Breast biopsy 2002   History  Substance Use Topics  . Smoking status: Never Smoker   . Smokeless tobacco: Never Used  . Alcohol Use: No   Family History  Problem Relation Age of Onset  . Diabetes Father   . Coronary artery disease Neg Hx   . Cancer Neg Hx   . Stroke Neg Hx   . Hypertension Neg Hx   . Asthma      great great grandmother   No Known Allergies Current Outpatient Prescriptions on File Prior to Visit  Medication Sig Dispense Refill  . acetaminophen (TYLENOL) 325 MG tablet Take 650 mg by mouth every 6 (six) hours as needed.        . Calcium Carbonate (CALTRATE 600 PO) Take 1 tablet by mouth daily.      . fluticasone (FLONASE)  50 MCG/ACT nasal spray Place 2 sprays into the nose daily.  16 g  2  . ibuprofen (ADVIL,MOTRIN) 200 MG tablet Take 200 mg by mouth every 6 (six) hours as needed.        Marland Kitchen levonorgestrel (MIRENA) 20 MCG/24HR IUD 1 each by Intrauterine route once.        . montelukast (SINGULAIR) 10 MG tablet Take 1 tablet (10 mg total) by mouth at bedtime.  30 tablet  11  . Multiple Vitamin (MULTIVITAMIN) tablet Take 1 tablet by mouth daily.        Review of Systems  Constitutional: Negative for fever, chills, activity change, appetite change, fatigue and unexpected weight change.  HENT: Negative for hearing loss and neck pain.   Eyes: Negative for visual disturbance.  Respiratory: Negative for cough, chest tightness, shortness of breath and wheezing.   Cardiovascular: Negative for chest pain, palpitations and leg swelling.  Gastrointestinal: Negative for nausea, vomiting, abdominal pain, diarrhea, constipation, blood in stool and abdominal distention.  Genitourinary: Negative for hematuria and difficulty urinating.  Musculoskeletal: Negative for myalgias and arthralgias.  Skin: Negative for rash.  Neurological: Negative for dizziness, seizures, syncope and headaches.  Hematological: Does not bruise/bleed easily.  Psychiatric/Behavioral: Negative for dysphoric mood. The patient is not  nervous/anxious.        Objective:   Physical Exam  Nursing note and vitals reviewed. Constitutional: She is oriented to person, place, and time. She appears well-developed and well-nourished. No distress.  HENT:  Head: Normocephalic and atraumatic.  Right Ear: Hearing, tympanic membrane, external ear and ear canal normal.  Left Ear: Hearing, tympanic membrane, external ear and ear canal normal.  Nose: Nose normal.  Mouth/Throat: Oropharynx is clear and moist. No oropharyngeal exudate.  Eyes: Conjunctivae normal and EOM are normal. Pupils are equal, round, and reactive to light. No scleral icterus.  Neck: Normal range  of motion. Neck supple.  Cardiovascular: Normal rate, regular rhythm, normal heart sounds and intact distal pulses.   No murmur heard. Pulses:      Radial pulses are 2+ on the right side, and 2+ on the left side.  Pulmonary/Chest: Effort normal and breath sounds normal. No respiratory distress. She has no wheezes. She has no rales.  Abdominal: Soft. Bowel sounds are normal. She exhibits no distension and no mass. There is no tenderness. There is no rebound and no guarding.  Musculoskeletal: Normal range of motion.  Lymphadenopathy:    She has no cervical adenopathy.  Neurological: She is alert and oriented to person, place, and time.       CN grossly intact, station and gait intact  Skin: Skin is warm and dry. No rash noted.  Psychiatric: She has a normal mood and affect. Her behavior is normal. Judgment and thought content normal.       Assessment & Plan:

## 2012-10-05 NOTE — Assessment & Plan Note (Signed)
Significant improvement with singulair and flonase - continue.  Appreciate pulm assistance.

## 2012-10-05 NOTE — Assessment & Plan Note (Signed)
Very mild, no need for meds. Discussed again low chol diet.   Recheck today.

## 2012-10-05 NOTE — Assessment & Plan Note (Signed)
Preventative protocols reviewed and updated unless pt declined. Discussed healthy diet and lifestyle.  Encouraged continued activity during day.

## 2012-10-05 NOTE — Patient Instructions (Signed)
Good to see you today, call us with questions. Return in 1 year or as needed. Cholesterol check today.

## 2012-10-06 LAB — LIPID PANEL
HDL: 44.5 mg/dL (ref >39.00)
Total CHOL/HDL Ratio: 5
Triglycerides: 121 mg/dL (ref 0.0–149.0)
VLDL: 24.2 mg/dL (ref 0.0–40.0)

## 2012-10-06 LAB — GLUCOSE, RANDOM: Glucose, Bld: 83 mg/dL (ref 70–99)

## 2012-10-06 LAB — LDL CHOLESTEROL, DIRECT: Direct LDL: 166.1 mg/dL

## 2013-05-13 ENCOUNTER — Telehealth: Payer: Self-pay | Admitting: *Deleted

## 2013-05-13 NOTE — Telephone Encounter (Addendum)
Will recommend office visit for evaluation and to discuss

## 2013-05-13 NOTE — Telephone Encounter (Signed)
Dr. Reece Agar got to this before I did, see his note.  Thanks.

## 2013-05-13 NOTE — Telephone Encounter (Signed)
Message left notifying patient. Advised she could go to Saturday clinic tomorrow, [facility] or come here on Monday. Advised to call with any questions

## 2013-05-13 NOTE — Telephone Encounter (Signed)
Patient called and is in the process of moving and was cleaning out her attic. She apparently stirred up some dust which has caused her to start coughing. She has a hx of chronic cough which singulair and flonase have helped control immensely up until this episode. Prior to that regimen, Dr. Reece Agar had given her a short course of steroids to help calm the cough and she was asking if she could possibly have another short course. She is not SOB and has no wheezing or CP. Using her albuterol inhaler helps some as well as the hycodan at night, but she knows she can't use the albuterol all the time. Advair has been no help, but she has tried it. (She had samples left from a previous visit). [facility].

## 2013-05-18 ENCOUNTER — Encounter: Payer: Self-pay | Admitting: Family Medicine

## 2013-05-18 ENCOUNTER — Ambulatory Visit (INDEPENDENT_AMBULATORY_CARE_PROVIDER_SITE_OTHER): Payer: Commercial Managed Care - PPO | Admitting: Family Medicine

## 2013-05-18 VITALS — BP 120/86 | HR 86 | Temp 98.1°F | Wt 164.0 lb

## 2013-05-18 DIAGNOSIS — R05 Cough: Secondary | ICD-10-CM

## 2013-05-18 MED ORDER — MONTELUKAST SODIUM 10 MG PO TABS: 10.0000 mg | ORAL_TABLET | Freq: Every day | ORAL | Status: DC

## 2013-05-18 MED ORDER — PREDNISONE 20 MG PO TABS: ORAL_TABLET | ORAL | Status: DC

## 2013-05-18 MED ORDER — HYDROCOD POLST-CHLORPHEN POLST 10-8 MG/5ML PO LQCR: 5.0000 mL | Freq: Every evening | ORAL | Status: DC | PRN

## 2013-05-18 NOTE — Assessment & Plan Note (Addendum)
thought due to post nasal drip - improved on flonase and singulair Now with flare after dusty attic exposure - anticipate really due to dust allergen - as seasonality of prior chronic cough seemed to correlate with times she was exposed to attic [employer], etc), Will treat current flare with steroid course, continue singulair. Restart flonase after oral steroid course. Update if cough persistent. Pt agrees with plan.

## 2013-05-18 NOTE — Patient Instructions (Addendum)
Good to see you today, call us with questions. Steroid course today to calm inflammation in lungs. I do think this is due to dust exposure in attic recently. tussionex refilled. Give me an update if persistent cough.   Hold flonase and advair while on oral steroids.

## 2013-05-18 NOTE — Progress Notes (Signed)
  Subjective:    Patient ID: Lindsey Proctor, female    DOB: [birth year], 42 y.o.   MRN: [id number]  HPI CC: cough  Recent work in dusty attic - has exacerbated previous chronic cough attributed to PNDrip.  Cough going on 6 wks now.  Worse at night.  Dry cough.  Prior workup - CXR WNL, PFTs ordered but never completed. Singulair and flonase have significantly helped chronic cough, until current flare. Steroids have significantly helped in past as well.  Denies SOB, wheezing or CP. Some congestion.  Using her albuterol inhaler helps some as well as the hycodan at night. Advair has been no help, but she has tried it. (She had samples left from a previous visit).  Advair helps her breathe more freely, albuterol causes chest tightness occasionally.  Past Medical History  Diagnosis Date  . Fainting spell 2003  . History of chicken pox   . Overweight(278.02)     Family History  Problem Relation Age of Onset  . Diabetes Father   . Coronary artery disease Neg Hx   . Cancer Neg Hx   . Stroke Neg Hx   . Hypertension Neg Hx   . Asthma      great great grandmother    Review of Systems Per HPI    Objective:   Physical Exam  Nursing note and vitals reviewed. Constitutional: She appears well-developed and well-nourished. No distress.  HENT:  Head: Normocephalic and atraumatic.  Right Ear: Hearing, tympanic membrane, external ear and ear canal normal.  Left Ear: Hearing, tympanic membrane, external ear and ear canal normal.  Nose: Nose normal. No mucosal edema or rhinorrhea.  Mouth/Throat: Uvula is midline, oropharynx is clear and moist and mucous membranes are normal. No oropharyngeal exudate, posterior oropharyngeal edema, posterior oropharyngeal erythema or tonsillar abscesses.  Eyes: Conjunctivae and EOM are normal. Pupils are equal, round, and reactive to light. No scleral icterus.  Cardiovascular: Normal rate, regular rhythm, normal heart sounds and intact distal pulses.   No  murmur heard. Pulmonary/Chest: Effort normal and breath sounds normal. No respiratory distress. She has no wheezes. She has no rales.  Musculoskeletal: She exhibits no edema.  Skin: Skin is warm and dry. No rash noted.      Assessment & Plan:

## 2013-05-24 ENCOUNTER — Telehealth: Payer: Self-pay

## 2013-05-24 MED ORDER — AZITHROMYCIN 250 MG PO TABS: ORAL_TABLET | ORAL | Status: DC

## 2013-05-24 NOTE — Telephone Encounter (Signed)
Spoke with patient -  Pt worried about bronchitis causing sxs, I discussed likely more reactive airway reaction to recent exposure dust particles. Pt feels this cough is different from prior. Given longstanding nature of this cough, I do think it's reasonable to start with abx course will do course of zpack and advised f/u with Dr. Craige Cotta for further evaluation if not better - as was plan last year. Pt agrees with call.

## 2013-05-24 NOTE — Telephone Encounter (Signed)
Pt left v/m; was seen on 05/18/13;pt has finished prednisone and throat still feels closed and cough continues; pt request amoxicillin sent to [facility] as discussed at 05/18/13 visit. Please advise. Pt request cb when called in.

## 2013-06-08 ENCOUNTER — Ambulatory Visit: Payer: Self-pay | Admitting: Internal Medicine

## 2013-06-09 ENCOUNTER — Ambulatory Visit: Payer: Self-pay | Admitting: Internal Medicine

## 2013-06-17 LAB — HM PAP SMEAR: HM Pap smear: NORMAL

## 2013-09-27 ENCOUNTER — Other Ambulatory Visit: Payer: Self-pay | Admitting: Family Medicine

## 2013-09-27 DIAGNOSIS — E785 Hyperlipidemia, unspecified: Secondary | ICD-10-CM

## 2013-09-29 ENCOUNTER — Other Ambulatory Visit (INDEPENDENT_AMBULATORY_CARE_PROVIDER_SITE_OTHER): Payer: Commercial Managed Care - PPO

## 2013-09-29 DIAGNOSIS — Z0000 Encounter for general adult medical examination without abnormal findings: Secondary | ICD-10-CM

## 2013-09-29 DIAGNOSIS — E785 Hyperlipidemia, unspecified: Secondary | ICD-10-CM

## 2013-09-29 DIAGNOSIS — E663 Overweight: Secondary | ICD-10-CM

## 2013-09-29 LAB — BASIC METABOLIC PANEL
BUN: 13 mg/dL (ref 6–23)
Chloride: 107 mEq/L (ref 96–112)
GFR: 88.24 mL/min (ref >60.00)
Glucose, Bld: 94 mg/dL (ref 70–99)
Potassium: 3.9 mEq/L (ref 3.5–5.1)
Sodium: 138 mEq/L (ref 135–145)

## 2013-09-29 LAB — LIPID PANEL: HDL: 43.3 mg/dL (ref >39.00)

## 2013-10-06 ENCOUNTER — Encounter: Payer: Self-pay | Admitting: Family Medicine

## 2013-10-06 ENCOUNTER — Ambulatory Visit (INDEPENDENT_AMBULATORY_CARE_PROVIDER_SITE_OTHER): Payer: Commercial Managed Care - PPO | Admitting: Family Medicine

## 2013-10-06 VITALS — BP 116/86 | HR 72 | Temp 98.5°F | Ht 66.0 in | Wt 168.2 lb

## 2013-10-06 DIAGNOSIS — E663 Overweight: Secondary | ICD-10-CM

## 2013-10-06 DIAGNOSIS — E785 Hyperlipidemia, unspecified: Secondary | ICD-10-CM

## 2013-10-06 DIAGNOSIS — Z0000 Encounter for general adult medical examination without abnormal findings: Secondary | ICD-10-CM

## 2013-10-06 DIAGNOSIS — R05 Cough: Secondary | ICD-10-CM

## 2013-10-06 NOTE — Assessment & Plan Note (Signed)
Again reviewed #s with patient. Mild hyperlipidemia - consider recheck in 2 years. Pt has received low chol diet handout in the past.

## 2013-10-06 NOTE — Assessment & Plan Note (Signed)
largely controlled on singulair daily. Continue.

## 2013-10-06 NOTE — Assessment & Plan Note (Signed)
Preventative protocols reviewed and updated unless pt declined. Discussed healthy diet and lifestyle.  

## 2013-10-06 NOTE — Progress Notes (Signed)
Subjective:    Patient ID: Lindsey Proctor, female    DOB: Apr 02, 1971, 42 y.o.   MRN: [id number]  HPI CC: CPE  mild HLD - trying to maintain with low chol diet.   Wt Readings from Last 3 Encounters:  10/06/13 168 lb 4 oz (76.318 kg)  05/18/13 164 lb (74.39 kg)  10/05/12 168 lb 12 oz (76.544 kg)  Body mass index is 27.17 kg/(m^2).  Seat belt use discussed Sunscreen use discussed.  Preventative  Flu shot at work  Tetanus 2012  Well woman exam - OBGYN Dr. Arlyce Dice (09/2012). Pap not done.  Normal in psat. Mammogram - 2011 normal baseline. Due for rpt.   Caffeine: 2 20oz caffeinated drinks/day (diet mountain dew) Lives with husband, 2 children (son and daughter), 1 dog and cat Occupation: customer service call center Activity: walking some Diet: fruits and vegetables daily, lots of water, red meat 4 times a week, fish rarely  Medications and allergies reviewed and updated in chart.  Past histories reviewed and updated if relevant as below. Patient Active Problem List   Diagnosis Date Noted  . Mild hyperlipidemia 10/05/2012  . Healthcare maintenance 11/07/2011  . Chronic cough 09/01/2011  . Overweight 09/01/2011   Past Medical History  Diagnosis Date  . Fainting spell 2003  . History of chicken pox   . Overweight(278.02)   . Chronic cough 2012    thought due to post nasal drip - improved on flonase and singulair   Past Surgical History  Procedure Laterality Date  . Rhinoplasty  2000    for deviated septum  . Breast biopsy  2002   History  Substance Use Topics  . Smoking status: Never Smoker   . Smokeless tobacco: Never Used  . Alcohol Use: No   Family History  Problem Relation Age of Onset  . Diabetes Father   . Coronary artery disease Neg Hx   . Cancer Neg Hx   . Stroke Neg Hx   . Hypertension Neg Hx   . Asthma      great great grandmother   No Known Allergies Current Outpatient Prescriptions on File Prior to Visit  Medication Sig Dispense Refill  .  acetaminophen (TYLENOL) 325 MG tablet Take 650 mg by mouth every 6 (six) hours as needed.        . Calcium Carbonate (CALTRATE 600 PO) Take 1 tablet by mouth daily.      Marland Kitchen ibuprofen (ADVIL,MOTRIN) 200 MG tablet Take by mouth every 6 (six) hours as needed.       Marland Kitchen levonorgestrel (MIRENA) 20 MCG/24HR IUD 1 each by Intrauterine route once.        . montelukast (SINGULAIR) 10 MG tablet Take 1 tablet (10 mg total) by mouth at bedtime.  30 tablet  11  . Multiple Vitamin (MULTIVITAMIN) tablet Take 1 tablet by mouth daily.      . fluticasone (FLONASE) 50 MCG/ACT nasal spray Place 2 sprays into the nose daily.  16 g  2   No current facility-administered medications on file prior to visit.     Review of Systems  Constitutional: Negative for fever, chills, activity change, appetite change, fatigue and unexpected weight change.  HENT: Negative for hearing loss.   Eyes: Negative for visual disturbance.  Respiratory: Negative for cough, chest tightness, shortness of breath and wheezing.   Cardiovascular: Negative for chest pain, palpitations and leg swelling.  Gastrointestinal: Negative for nausea, vomiting, abdominal pain, diarrhea, constipation, blood in stool and abdominal distention.  Genitourinary:  Negative for hematuria and difficulty urinating.  Musculoskeletal: Negative for arthralgias, myalgias and neck pain.  Skin: Negative for rash.  Neurological: Negative for dizziness, seizures, syncope and headaches.  Hematological: Negative for adenopathy. Does not bruise/bleed easily.  Psychiatric/Behavioral: Negative for dysphoric mood. The patient is not nervous/anxious.        Objective:   Physical Exam  Nursing note and vitals reviewed. Constitutional: She is oriented to person, place, and time. She appears well-developed and well-nourished. No distress.  HENT:  Head: Normocephalic and atraumatic.  Right Ear: Hearing, tympanic membrane, external ear and ear canal normal.  Left Ear: Hearing,  tympanic membrane, external ear and ear canal normal.  Nose: Nose normal.  Mouth/Throat: Oropharynx is clear and moist. No oropharyngeal exudate.  Eyes: Conjunctivae and EOM are normal. Pupils are equal, round, and reactive to light. No scleral icterus.  Neck: Normal range of motion. Neck supple. No thyromegaly present.  Cardiovascular: Normal rate, regular rhythm, normal heart sounds and intact distal pulses.   No murmur heard. Pulses:      Radial pulses are 2+ on the right side, and 2+ on the left side.  Pulmonary/Chest: Effort normal and breath sounds normal. No respiratory distress. She has no wheezes. She has no rales.  Abdominal: Soft. Bowel sounds are normal. She exhibits no distension and no mass. There is no tenderness. There is no rebound and no guarding.  Musculoskeletal: Normal range of motion. She exhibits no edema.  Lymphadenopathy:    She has no cervical adenopathy.  Neurological: She is alert and oriented to person, place, and time.  CN grossly intact, station and gait intact  Skin: Skin is warm and dry. No rash noted.  Psychiatric: She has a normal mood and affect. Her behavior is normal. Judgment and thought content normal.       Assessment & Plan:

## 2013-10-06 NOTE — Progress Notes (Signed)
Pre-visit discussion using our clinic review tool. No additional management support is needed unless otherwise documented below in the visit note.  

## 2013-10-06 NOTE — Patient Instructions (Signed)
Good to see you today, call us with questions. I think you're doing well today - watch diet for lowering cholesterol. Return as needed or in 1-2 years for next physical.

## 2013-10-06 NOTE — Assessment & Plan Note (Signed)
Body mass index is 27.17 kg/(m^2).

## 2013-12-26 ENCOUNTER — Ambulatory Visit (INDEPENDENT_AMBULATORY_CARE_PROVIDER_SITE_OTHER): Payer: Commercial Managed Care - PPO | Admitting: Family Medicine

## 2013-12-26 ENCOUNTER — Encounter: Payer: Self-pay | Admitting: Family Medicine

## 2013-12-26 VITALS — BP 110/72 | HR 82 | Temp 98.0°F | Ht 66.0 in | Wt 180.5 lb

## 2013-12-26 DIAGNOSIS — J019 Acute sinusitis, unspecified: Secondary | ICD-10-CM

## 2013-12-26 MED ORDER — AMOXICILLIN 500 MG PO CAPS: 1000.0000 mg | ORAL_CAPSULE | Freq: Two times a day (BID) | ORAL | Status: DC

## 2013-12-26 NOTE — Assessment & Plan Note (Addendum)
Likely viral infection... Suggest making change in regimen to mucolytic, decongestant and nasal saline irrgaiton.  if not improving fill antibiotics for possible sinus infection.

## 2013-12-26 NOTE — Progress Notes (Signed)
Pre-visit discussion using our clinic review tool. No additional management support is needed unless otherwise documented below in the visit note.  

## 2013-12-26 NOTE — Progress Notes (Signed)
   Subjective:    Patient ID: Lindsey Proctor, female    DOB: 08/18/1971, 43 y.o.   MRN: [id number]  HPI Comments: On Singulair and Flonase for asthma cough variant... Not flaring now. Daughter sinus infection last week.  Sinusitis This is a new problem. The current episode started in the past 7 days (5 days). The problem has been gradually worsening since onset. There has been no fever. The pain is moderate. Associated symptoms include congestion, coughing, sinus pressure and a sore throat. Pertinent negatives include no ear pain or shortness of breath. (No sore throat, dry throat  fatigue, body ache  post nasal drip. Dry cough.  frontal B sinus pressure) Treatments tried: sudafed PR, tyelnol.      Review of Systems  HENT: Positive for congestion, sinus pressure and sore throat. Negative for ear pain.   Respiratory: Positive for cough. Negative for shortness of breath.        Objective:   Physical Exam  Constitutional: Vital signs are normal. She appears well-developed and well-nourished. She is cooperative.  Non-toxic appearance. She does not appear ill. No distress.  HENT:  Head: Normocephalic.  Right Ear: Hearing, tympanic membrane, external ear and ear canal normal. Tympanic membrane is not erythematous, not retracted and not bulging. No middle ear effusion.  Left Ear: Hearing, tympanic membrane, external ear and ear canal normal. Tympanic membrane is not erythematous, not retracted and not bulging.  No middle ear effusion.  Nose: Mucosal edema and rhinorrhea present. Right sinus exhibits frontal sinus tenderness. Right sinus exhibits no maxillary sinus tenderness. Left sinus exhibits frontal sinus tenderness. Left sinus exhibits no maxillary sinus tenderness.  Mouth/Throat: Uvula is midline and mucous membranes are normal. Posterior oropharyngeal erythema present. No oropharyngeal exudate, posterior oropharyngeal edema or tonsillar abscesses.  Eyes: Conjunctivae, EOM and lids  are normal. Pupils are equal, round, and reactive to light. Lids are everted and swept, no foreign bodies found.  Neck: Trachea normal and normal range of motion. Neck supple. Carotid bruit is not present. No mass and no thyromegaly present.  Cardiovascular: Normal rate, regular rhythm, S1 normal, S2 normal, normal heart sounds, intact distal pulses and normal pulses.  Exam reveals no gallop and no friction rub.   No murmur heard. Pulmonary/Chest: Effort normal and breath sounds normal. Not tachypneic. No respiratory distress. She has no decreased breath sounds. She has no wheezes. She has no rhonchi. She has no rales.  Neurological: She is alert.  Skin: Skin is warm, dry and intact. No rash noted.  Psychiatric: Her speech is normal and behavior is normal. Judgment normal. Her mood appears not anxious. Cognition and memory are normal. She does not exhibit a depressed mood.          Assessment & Plan:

## 2013-12-26 NOTE — Patient Instructions (Signed)
Mucinex D twice daily, flonase continue, nasal saline 3-4 times a day. If not improving or if new fever in next 24-48 hours start antibiotics.

## 2014-06-17 LAB — HM MAMMOGRAPHY: HM MAMMO: NORMAL

## 2014-06-20 LAB — CBC
HGB: 13 g/dL
PLATELET COUNT: 209
WBC: 7.3

## 2014-06-20 LAB — TSH: TSH: 5.57

## 2014-09-01 LAB — TSH: TSH: 0.69

## 2014-10-31 ENCOUNTER — Ambulatory Visit (INDEPENDENT_AMBULATORY_CARE_PROVIDER_SITE_OTHER): Payer: Commercial Managed Care - PPO | Admitting: Family Medicine

## 2014-10-31 ENCOUNTER — Encounter: Payer: Self-pay | Admitting: Family Medicine

## 2014-10-31 VITALS — BP 114/78 | HR 72 | Temp 98.4°F | Ht 65.5 in | Wt 184.0 lb

## 2014-10-31 DIAGNOSIS — J019 Acute sinusitis, unspecified: Secondary | ICD-10-CM

## 2014-10-31 DIAGNOSIS — E039 Hypothyroidism, unspecified: Secondary | ICD-10-CM

## 2014-10-31 DIAGNOSIS — R053 Chronic cough: Secondary | ICD-10-CM

## 2014-10-31 DIAGNOSIS — R05 Cough: Secondary | ICD-10-CM

## 2014-10-31 DIAGNOSIS — Z0000 Encounter for general adult medical examination without abnormal findings: Secondary | ICD-10-CM

## 2014-10-31 DIAGNOSIS — E785 Hyperlipidemia, unspecified: Secondary | ICD-10-CM

## 2014-10-31 HISTORY — DX: Hypothyroidism, unspecified: E03.9

## 2014-10-31 LAB — BASIC METABOLIC PANEL
BUN: 14 mg/dL (ref 6–23)
CHLORIDE: 105 meq/L (ref 96–112)
CO2: 24 mEq/L (ref 19–32)
CREATININE: 0.7 mg/dL (ref 0.4–1.2)
Calcium: 9.4 mg/dL (ref 8.4–10.5)
GFR: 93.44 mL/min (ref >60.00)
Glucose, Bld: 97 mg/dL (ref 70–99)
Potassium: 3.7 mEq/L (ref 3.5–5.1)
Sodium: 137 mEq/L (ref 135–145)

## 2014-10-31 LAB — LIPID PANEL
CHOL/HDL RATIO: 6
Cholesterol: 238 mg/dL — ABNORMAL HIGH (ref 0–200)
HDL: 40.1 mg/dL (ref >39.00)
LDL Cholesterol: 162 mg/dL — ABNORMAL HIGH (ref 0–99)
NONHDL: 197.9
Triglycerides: 179 mg/dL — ABNORMAL HIGH (ref 0.0–149.0)
VLDL: 35.8 mg/dL (ref 0.0–40.0)

## 2014-10-31 LAB — T4, FREE: FREE T4: 0.81 ng/dL (ref 0.60–1.60)

## 2014-10-31 LAB — TSH: TSH: 1.16 u[IU]/mL (ref 0.35–4.50)

## 2014-10-31 MED ORDER — ALBUTEROL SULFATE HFA 108 (90 BASE) MCG/ACT IN AERS: 2.0000 | INHALATION_SPRAY | Freq: Four times a day (QID) | RESPIRATORY_TRACT | Status: DC | PRN

## 2014-10-31 MED ORDER — AMOXICILLIN 500 MG PO CAPS: 1000.0000 mg | ORAL_CAPSULE | Freq: Two times a day (BID) | ORAL | Status: DC

## 2014-10-31 MED ORDER — HYDROCOD POLST-CHLORPHEN POLST 10-8 MG/5ML PO LQCR: 5.0000 mL | Freq: Every evening | ORAL | Status: DC | PRN

## 2014-10-31 MED ORDER — MONTELUKAST SODIUM 10 MG PO TABS: 10.0000 mg | ORAL_TABLET | Freq: Every day | ORAL | Status: DC

## 2014-10-31 NOTE — Assessment & Plan Note (Addendum)
Continue flonase/singulair regimen. Refilled albuterol to use PRN.

## 2014-10-31 NOTE — Assessment & Plan Note (Addendum)
Diet controlled, recheck FLP and cbg today.

## 2014-10-31 NOTE — Progress Notes (Signed)
Pre visit review using our clinic review tool, if applicable. No additional management support is needed unless otherwise documented below in the visit note. 

## 2014-10-31 NOTE — Patient Instructions (Addendum)
May take amoxicillin course for cough/sinusitis. May take tussionex at night time for cough. I've refilled singulair and albuterol.  Push fluids and rest labwork today.

## 2014-10-31 NOTE — Progress Notes (Signed)
BP 114/78 mmHg  Pulse 72  Temp(Src) 98.4 F (36.9 C) (Oral)  Ht 5' 5.5" (1.664 m)  Wt 184 lb (83.462 kg)  BMI 30.14 kg/m2   CC: CPE  Subjective:    Patient ID: Lindsey Proctor, female    DOB: 10/17/1971, 43 y.o.   MRN: [id number]  HPI: Lindsey Proctor is a 43 y.o. female presenting on 10/31/2014 for Annual Exam   Recent dx hypothyroidism with TSH 5.57 by OBGYN - started on levothyroxine 75mcg daily. On recheck TSH 0.69. Pt endorses increased sleepiness, lethargy, fatigue, trouble sleeping, weight gain despite healthy diet and regular exercise. Felt better with initiation of levothyroxine, but now sxs have recurred.   Recurrent allergic cough - currently 8th week of cough. 2d ago with temp to 99 degrees. Recently decorating, around dust. increased productive cough with hoarseness. Headache and sinus pain present.   CBC WNL.  Preventative: Well woman exam - OBGYN Dr. Arlyce DiceKaplan (06/2014). Normal pap Mammogram - 06/2014 normal. Flu shot at work  Tdap 2012 Seat belt use discussed Sunscreen use discussed.  Caffeine: 2 20oz caffeinated drinks/day (diet mountain dew) Lives with husband, 2 children (son and daughter), 1 dog and cat Occupation: customer service call center Activity: walking some Diet: fruits and vegetables daily, lots of water, red meat 4 times a week, fish rarely  Relevant past medical, surgical, family and social history reviewed and updated as indicated. Interim medical history since our last visit reviewed. Allergies and medications reviewed and updated.  Current Outpatient Prescriptions on File Prior to Visit  Medication Sig  . acetaminophen (TYLENOL) 325 MG tablet Take 650 mg by mouth every 6 (six) hours as needed.    . Calcium Carbonate (CALTRATE 600 PO) Take 1 tablet by mouth daily.  . fluticasone (FLONASE) 50 MCG/ACT nasal spray Place 2 sprays into the nose daily.  Marland Kitchen. ibuprofen (ADVIL,MOTRIN) 200 MG tablet Take by mouth every 6 (six) hours as needed.   Marland Kitchen.  levonorgestrel (MIRENA) 20 MCG/24HR IUD 1 each by Intrauterine route once.    . Multiple Vitamin (MULTIVITAMIN) tablet Take 1 tablet by mouth daily.   No current facility-administered medications on file prior to visit.    Review of Systems  Constitutional: Negative for fever, chills, activity change, appetite change, fatigue and unexpected weight change.  HENT: Positive for congestion, postnasal drip, sinus pressure, sore throat and voice change (hoarse). Negative for hearing loss.   Eyes: Negative for visual disturbance.  Respiratory: Positive for cough (chronic). Negative for chest tightness, shortness of breath and wheezing.   Cardiovascular: Negative for chest pain, palpitations and leg swelling.  Gastrointestinal: Negative for nausea, vomiting, abdominal pain, diarrhea, constipation, blood in stool and abdominal distention.  Genitourinary: Negative for hematuria and difficulty urinating.  Musculoskeletal: Negative for myalgias, arthralgias and neck pain.  Skin: Negative for rash.  Neurological: Positive for headaches. Negative for dizziness, seizures and syncope.  Hematological: Negative for adenopathy. Does not bruise/bleed easily.  Psychiatric/Behavioral: Negative for dysphoric mood. The patient is not nervous/anxious.    Per HPI unless specifically indicated above     Objective:    BP 114/78 mmHg  Pulse 72  Temp(Src) 98.4 F (36.9 C) (Oral)  Ht 5' 5.5" (1.664 m)  Wt 184 lb (83.462 kg)  BMI 30.14 kg/m2  Wt Readings from Last 3 Encounters:  10/31/14 184 lb (83.462 kg)  12/26/13 180 lb 8 oz (81.874 kg)  10/06/13 168 lb 4 oz (76.318 kg)    Physical Exam  Constitutional: She is  oriented to person, place, and time. She appears well-developed and well-nourished. No distress.  Hoarse voice  HENT:  Head: Normocephalic and atraumatic.  Right Ear: Hearing, tympanic membrane, external ear and ear canal normal.  Left Ear: Hearing, tympanic membrane, external ear and ear canal  normal.  Nose: No mucosal edema or rhinorrhea. Right sinus exhibits maxillary sinus tenderness. Right sinus exhibits no frontal sinus tenderness. Left sinus exhibits maxillary sinus tenderness. Left sinus exhibits no frontal sinus tenderness.  Mouth/Throat: Uvula is midline and mucous membranes are normal. Posterior oropharyngeal erythema present. No oropharyngeal exudate or posterior oropharyngeal edema.  Eyes: Conjunctivae and EOM are normal. Pupils are equal, round, and reactive to light. No scleral icterus.  Neck: Normal range of motion. Neck supple. No thyromegaly present.  Cardiovascular: Normal rate, regular rhythm, normal heart sounds and intact distal pulses.   No murmur heard. Pulses:      Radial pulses are 2+ on the right side, and 2+ on the left side.  Pulmonary/Chest: Effort normal and breath sounds normal. No respiratory distress. She has no wheezes. She has no rales.  Abdominal: Soft. Bowel sounds are normal. She exhibits no distension and no mass. There is no tenderness. There is no rebound and no guarding.  Musculoskeletal: Normal range of motion. She exhibits no edema.  Lymphadenopathy:    She has no cervical adenopathy.  Neurological: She is alert and oriented to person, place, and time.  CN grossly intact, station and gait intact  Skin: Skin is warm and dry. No rash noted.  Psychiatric: She has a normal mood and affect. Her behavior is normal. Judgment and thought content normal.  Nursing note and vitals reviewed.  Results for orders placed or performed in visit on 09/29/13  Lipid panel  Result Value Ref Range   Cholesterol 225 (H) 0 - 200 mg/dL   Triglycerides 782.9 0.0 - 149.0 mg/dL   HDL 56.21 >30.86 mg/dL   VLDL 57.8 0.0 - 46.9 mg/dL   Total CHOL/HDL Ratio 5   Basic metabolic panel  Result Value Ref Range   Sodium 138 135 - 145 mEq/L   Potassium 3.9 3.5 - 5.1 mEq/L   Chloride 107 96 - 112 mEq/L   CO2 25 19 - 32 mEq/L   Glucose, Bld 94 70 - 99 mg/dL   BUN 13  6 - 23 mg/dL   Creatinine, Ser 0.8 0.4 - 1.2 mg/dL   Calcium 9.1 8.4 - 62.9 mg/dL   GFR 52.84 >13.24 mL/min  TSH  Result Value Ref Range   TSH 0.65 0.35 - 5.50 uIU/mL  LDL cholesterol, direct  Result Value Ref Range   Direct LDL 168.0 mg/dL      Assessment & Plan:   Problem List Items Addressed This Visit    Mild hyperlipidemia    Diet controlled, recheck FLP and cbg today.    Relevant Orders      Lipid panel      Basic metabolic panel   Hypothyroidism    Check thyroid panel today.    Relevant Medications      levothyroxine (SYNTHROID, LEVOTHROID) 75 MCG tablet   Other Relevant Orders      TSH      T4, free      T3   Healthcare maintenance - Primary    Preventative protocols reviewed and updated unless pt declined. Discussed healthy diet and lifestyle.    Chronic cough    Continue flonase/singulair regimen. Refilled albuterol to use PRN.  Follow up plan: Return in about 1 year (around 11/01/2015), or if symptoms worsen or fail to improve, for annual exam, prior fasting for blood work.

## 2014-10-31 NOTE — Assessment & Plan Note (Signed)
Check thyroid panel today 

## 2014-10-31 NOTE — Assessment & Plan Note (Signed)
Acute worsening URI sxs after 8wk cough cycle. Concern for bacterial sinusitis - treat with amoxicillin course. Update if not improved as expected.

## 2014-10-31 NOTE — Assessment & Plan Note (Signed)
Preventative protocols reviewed and updated unless pt declined. Discussed healthy diet and lifestyle.  

## 2014-11-01 ENCOUNTER — Encounter: Payer: Self-pay | Admitting: *Deleted

## 2014-11-01 LAB — T3: T3 TOTAL: 111.8 ng/dL (ref 80.0–204.0)

## 2014-11-13 ENCOUNTER — Encounter: Payer: Self-pay | Admitting: Family Medicine

## 2015-01-01 ENCOUNTER — Other Ambulatory Visit: Payer: Self-pay | Admitting: *Deleted

## 2015-01-01 MED ORDER — LEVOTHYROXINE SODIUM 75 MCG PO TABS: 75.0000 ug | ORAL_TABLET | Freq: Every day | ORAL | Status: DC

## 2015-04-12 ENCOUNTER — Ambulatory Visit (INDEPENDENT_AMBULATORY_CARE_PROVIDER_SITE_OTHER): Payer: Commercial Managed Care - PPO | Admitting: Family Medicine

## 2015-04-12 ENCOUNTER — Encounter: Payer: Self-pay | Admitting: Family Medicine

## 2015-04-12 VITALS — BP 112/76 | HR 84 | Temp 98.2°F | Ht 66.0 in | Wt 191.0 lb

## 2015-04-12 DIAGNOSIS — R509 Fever, unspecified: Secondary | ICD-10-CM

## 2015-04-12 LAB — POCT RAPID STREP A (OFFICE): Rapid Strep A Screen: NEGATIVE

## 2015-04-12 LAB — POCT INFLUENZA A/B
INFLUENZA B, POC: NEGATIVE
Influenza A, POC: NEGATIVE

## 2015-04-12 MED ORDER — HYDROCOD POLST-CPM POLST ER 10-8 MG/5ML PO SUER: 5.0000 mL | Freq: Every evening | ORAL | Status: DC | PRN

## 2015-04-12 MED ORDER — AZITHROMYCIN 250 MG PO TABS: ORAL_TABLET | ORAL | Status: DC

## 2015-04-12 NOTE — Patient Instructions (Addendum)
You have a viral upper respiratory infection. Antibiotics are not needed for this.  Viral infections usually take 7-10 days to resolve.  The cough can last a few weeks to go away. Use medication as prescribed: tussionex Push fluids and plenty of rest. Please return if you are not improving as expected, or if you have high fevers (>101.5) or difficulty swallowing or worsening productive cough. Call clinic with questions.  Good to see you today. I hope you start feeling better soon.

## 2015-04-12 NOTE — Progress Notes (Signed)
BP 112/76 mmHg  Pulse 84  Temp(Src) 98.2 F (36.8 C) (Oral)  Ht  (1.676 m)  Wt 191 lb (86.637 kg)  BMI 30.84 kg/m2  SpO2 96%   CC: fever, cough, nasal congestion  Subjective:    Patient ID: Illene Regulus, female    DOB: Apr 06, 1971, 44 y.o.   MRN: [id number]  HPI: DYLAN RUOTOLO is a 44 y.o. female presenting on 04/12/2015 for Fever; Cough; and Nasal Congestion   4d h/o recurrent fever to 102, ST, nasal congestion, coughing productive of green mucous. Sudden onset of sxs. Pressure headaches. Chest pain with cough. Trouble sleeping at night 2/2 cough.  No body aches, joint pains. No ear or tooth pain, nausea, abd pain, wheezing or dyspnea.   Has tried ibuprofen, tylenol and dayquil  4 family members with similar sxs. RST neg with family members (and negative throat culture) but flu was not run. Step parents sick prior to this illness.  Pt did receive flu shot this year.  Has allergic cough that improves with albuterol inhaler.   Relevant past medical, surgical, family and social history reviewed and updated as indicated. Interim medical history since our last visit reviewed. Allergies and medications reviewed and updated. Current Outpatient Prescriptions on File Prior to Visit  Medication Sig  . acetaminophen (TYLENOL) 325 MG tablet Take 650 mg by mouth every 6 (six) hours as needed.    Marland Kitchen albuterol (PROVENTIL HFA;VENTOLIN HFA) 108 (90 BASE) MCG/ACT inhaler Inhale 2 puffs into the lungs every 6 (six) hours as needed for wheezing or shortness of breath.  . Calcium Carbonate (CALTRATE 600 PO) Take 1 tablet by mouth daily.  . fluticasone (FLONASE) 50 MCG/ACT nasal spray Place 2 sprays into the nose daily.  Marland Kitchen ibuprofen (ADVIL,MOTRIN) 200 MG tablet Take by mouth every 6 (six) hours as needed.   Marland Kitchen levonorgestrel (MIRENA) 20 MCG/24HR IUD 1 each by Intrauterine route once.    Marland Kitchen levothyroxine (SYNTHROID, LEVOTHROID) 75 MCG tablet Take 1 tablet (75 mcg total) by mouth daily  before breakfast.  . montelukast (SINGULAIR) 10 MG tablet Take 1 tablet (10 mg total) by mouth at bedtime.  . Multiple Vitamin (MULTIVITAMIN) tablet Take 1 tablet by mouth daily.   No current facility-administered medications on file prior to visit.    Review of Systems Per HPI unless specifically indicated above     Objective:    BP 112/76 mmHg  Pulse 84  Temp(Src) 98.2 F (36.8 C) (Oral)  Ht  (1.676 m)  Wt 191 lb (86.637 kg)  BMI 30.84 kg/m2  SpO2 96%  Wt Readings from Last 3 Encounters:  04/12/15 191 lb (86.637 kg)  10/31/14 184 lb (83.462 kg)  12/26/13 180 lb 8 oz (81.874 kg)    Physical Exam  Constitutional: She appears well-developed and well-nourished. No distress.  HENT:  Head: Normocephalic and atraumatic.  Right Ear: Hearing, tympanic membrane, external ear and ear canal normal.  Left Ear: Hearing, tympanic membrane, external ear and ear canal normal.  Nose: Mucosal edema (mild nasal mucosal injection) present. No rhinorrhea. Right sinus exhibits maxillary sinus tenderness. Right sinus exhibits no frontal sinus tenderness. Left sinus exhibits maxillary sinus tenderness. Left sinus exhibits no frontal sinus tenderness.  Mouth/Throat: Uvula is midline and mucous membranes are normal. Posterior oropharyngeal erythema present. No oropharyngeal exudate, posterior oropharyngeal edema or tonsillar abscesses.  Raw oropharynx with blisters at tonsillar pillars  Eyes: Conjunctivae and EOM are normal. Pupils are equal, round, and reactive to light. No  scleral icterus.  Neck: Normal range of motion. Neck supple.  Cardiovascular: Normal rate, regular rhythm, normal heart sounds and intact distal pulses.   No murmur heard. Pulmonary/Chest: Effort normal and breath sounds normal. No respiratory distress. She has no wheezes. She has no rales.  Lymphadenopathy:    She has no cervical adenopathy.  Skin: Skin is warm and dry. No rash noted.  Nursing note and vitals  reviewed.     Assessment & Plan:   Problem List Items Addressed This Visit    Fever - Primary    High fever >101.5 with URI sxs and productive cough with multiple sick family members.  Check flu swab today - negative. If normal, check RST - negative. Lungs clear, good O2 sats today. Anticipate viral URI - and recommended supportive care. She does have history of prolonged post -infectious cough in the past, and with high fever and upcoming long weekend, discussed WASP for zpack with indications on when to fill. Will treat cough with tussionex. Pt agrees with plan.      Relevant Orders   POCT rapid strep A (Completed)   POCT Influenza A/B (Completed)       Follow up plan: Return if symptoms worsen or fail to improve.

## 2015-04-12 NOTE — Progress Notes (Signed)
Pre visit review using our clinic review tool, if applicable. No additional management support is needed unless otherwise documented below in the visit note. 

## 2015-04-12 NOTE — Assessment & Plan Note (Addendum)
High fever >101.5 with URI sxs and productive cough with multiple sick family members.  Check flu swab today - negative. If normal, check RST - negative. Lungs clear, good O2 sats today. Anticipate viral URI - and recommended supportive care. She does have history of prolonged post -infectious cough in the past, and with high fever and upcoming long weekend, discussed WASP for zpack with indications on when to fill. Will treat cough with tussionex. Pt agrees with plan.

## 2015-04-18 DIAGNOSIS — S060X9A Concussion with loss of consciousness of unspecified duration, initial encounter: Secondary | ICD-10-CM

## 2015-04-18 DIAGNOSIS — S060XAA Concussion with loss of consciousness status unknown, initial encounter: Secondary | ICD-10-CM

## 2015-04-18 HISTORY — DX: Concussion with loss of consciousness status unknown, initial encounter: S06.0XAA

## 2015-04-18 HISTORY — DX: Concussion with loss of consciousness of unspecified duration, initial encounter: S06.0X9A

## 2015-07-19 LAB — HM PAP SMEAR: HM Pap smear: NORMAL

## 2015-07-19 LAB — HM MAMMOGRAPHY: HM Mammogram: NORMAL (ref 0–4)

## 2015-09-02 ENCOUNTER — Other Ambulatory Visit: Payer: Self-pay | Admitting: Family Medicine

## 2015-09-05 ENCOUNTER — Ambulatory Visit (INDEPENDENT_AMBULATORY_CARE_PROVIDER_SITE_OTHER): Payer: Commercial Managed Care - PPO | Admitting: Family Medicine

## 2015-09-05 ENCOUNTER — Encounter: Payer: Self-pay | Admitting: Family Medicine

## 2015-09-05 VITALS — BP 110/60 | HR 72 | Temp 97.7°F | Ht 66.0 in | Wt 183.5 lb

## 2015-09-05 DIAGNOSIS — Z0000 Encounter for general adult medical examination without abnormal findings: Secondary | ICD-10-CM

## 2015-09-05 DIAGNOSIS — E785 Hyperlipidemia, unspecified: Secondary | ICD-10-CM

## 2015-09-05 DIAGNOSIS — E663 Overweight: Secondary | ICD-10-CM

## 2015-09-05 DIAGNOSIS — R5383 Other fatigue: Secondary | ICD-10-CM | POA: Insufficient documentation

## 2015-09-05 DIAGNOSIS — R5382 Chronic fatigue, unspecified: Secondary | ICD-10-CM | POA: Diagnosis not present

## 2015-09-05 DIAGNOSIS — E039 Hypothyroidism, unspecified: Secondary | ICD-10-CM

## 2015-09-05 LAB — LIPID PANEL
CHOLESTEROL: 230 mg/dL — AB (ref 0–200)
HDL: 44.3 mg/dL (ref >39.00)
LDL CALC: 147 mg/dL — AB (ref 0–99)
NonHDL: 186.1
TRIGLYCERIDES: 194 mg/dL — AB (ref 0.0–149.0)
Total CHOL/HDL Ratio: 5
VLDL: 38.8 mg/dL (ref 0.0–40.0)

## 2015-09-05 LAB — CBC WITH DIFFERENTIAL/PLATELET
BASOS ABS: 0 10*3/uL (ref 0.0–0.1)
Basophils Relative: 0.5 % (ref 0.0–3.0)
EOS ABS: 0.1 10*3/uL (ref 0.0–0.7)
EOS PCT: 1 % (ref 0.0–5.0)
HCT: 38.8 % (ref 36.0–46.0)
HEMOGLOBIN: 13 g/dL (ref 12.0–15.0)
LYMPHS ABS: 2.2 10*3/uL (ref 0.7–4.0)
Lymphocytes Relative: 34.7 % (ref 12.0–46.0)
MCHC: 33.4 g/dL (ref 30.0–36.0)
MCV: 88.8 fl (ref 78.0–100.0)
MONO ABS: 0.5 10*3/uL (ref 0.1–1.0)
Monocytes Relative: 7.4 % (ref 3.0–12.0)
NEUTROS PCT: 56.4 % (ref 43.0–77.0)
Neutro Abs: 3.5 10*3/uL (ref 1.4–7.7)
Platelets: 199 10*3/uL (ref 150.0–400.0)
RBC: 4.37 Mil/uL (ref 3.87–5.11)
RDW: 13.3 % (ref 11.5–15.5)
WBC: 6.2 10*3/uL (ref 4.0–10.5)

## 2015-09-05 LAB — T4, FREE: FREE T4: 0.68 ng/dL (ref 0.60–1.60)

## 2015-09-05 LAB — COMPREHENSIVE METABOLIC PANEL
ALBUMIN: 4.2 g/dL (ref 3.5–5.2)
ALK PHOS: 71 U/L (ref 39–117)
ALT: 21 U/L (ref 0–35)
AST: 18 U/L (ref 0–37)
BUN: 15 mg/dL (ref 6–23)
CALCIUM: 9.7 mg/dL (ref 8.4–10.5)
CHLORIDE: 106 meq/L (ref 96–112)
CO2: 29 mEq/L (ref 19–32)
CREATININE: 0.68 mg/dL (ref 0.40–1.20)
GFR: 99.43 mL/min (ref >60.00)
Glucose, Bld: 99 mg/dL (ref 70–99)
Potassium: 4.2 mEq/L (ref 3.5–5.1)
SODIUM: 142 meq/L (ref 135–145)
TOTAL PROTEIN: 7.2 g/dL (ref 6.0–8.3)
Total Bilirubin: 0.3 mg/dL (ref 0.2–1.2)

## 2015-09-05 LAB — VITAMIN B12: VITAMIN B 12: 178 pg/mL — AB (ref 211–911)

## 2015-09-05 LAB — VITAMIN D 25 HYDROXY (VIT D DEFICIENCY, FRACTURES): VITD: 12.24 ng/mL — AB (ref 30.00–100.00)

## 2015-09-05 LAB — TSH: TSH: 1.33 u[IU]/mL (ref 0.35–4.50)

## 2015-09-05 MED ORDER — MONTELUKAST SODIUM 10 MG PO TABS: 10.0000 mg | ORAL_TABLET | Freq: Every day | ORAL | Status: DC

## 2015-09-05 NOTE — Assessment & Plan Note (Signed)
Check TFTs today

## 2015-09-05 NOTE — Assessment & Plan Note (Signed)
Discussed healthy diet and lifestyle changes to affect sustainable weight loss. Pt may try elliptical and diet changes

## 2015-09-05 NOTE — Patient Instructions (Addendum)
We will refill thyroid medicine when we get lab results. labwork today. Nice to see you today, call us with questions. Return as needed or in 1 year for next physical.  Health Maintenance, Female Adopting a healthy lifestyle and getting preventive care can go a long way to promote health and wellness. Talk with your health care provider about what schedule of regular examinations is right for you. This is a good chance for you to check in with your provider about disease prevention and staying healthy. In between checkups, there are plenty of things you can do on your own. Experts have done a lot of research about which lifestyle changes and preventive measures are most likely to keep you healthy. Ask your health care provider for more information. WEIGHT AND DIET  Eat a healthy diet  Be sure to include plenty of vegetables, fruits, low-fat dairy products, and lean protein.  Do not eat a lot of foods high in solid fats, added sugars, or salt.  Get regular exercise. This is one of the most important things you can do for your health.  Most adults should exercise for at least 150 minutes each week. The exercise should increase your heart rate and make you sweat (moderate-intensity exercise).  Most adults should also do strengthening exercises at least twice a week. This is in addition to the moderate-intensity exercise.  Maintain a healthy weight  Body mass index (BMI) is a measurement that can be used to identify possible weight problems. It estimates body fat based on height and weight. Your health care provider can help determine your BMI and help you achieve or maintain a healthy weight.  For females 67 years of age and older:   A BMI below 18.5 is considered underweight.  A BMI of 18.5 to 24.9 is normal.  A BMI of 25 to 29.9 is considered overweight.  A BMI of 30 and above is considered obese.  Watch levels of cholesterol and blood lipids  You should start having your blood  tested for lipids and cholesterol at 45 years of age, then have this test every 5 years.  You may need to have your cholesterol levels checked more often if:  Your lipid or cholesterol levels are high.  You are older than 45 years of age.  You are at high risk for heart disease.  CANCER SCREENING   Lung Cancer  Lung cancer screening is recommended for adults 68-57 years old who are at high risk for lung cancer because of a history of smoking.  A yearly low-dose CT scan of the lungs is recommended for people who:  Currently smoke.  Have quit within the past 15 years.  Have at least a 30-pack-year history of smoking. A pack year is smoking an average of one pack of cigarettes a day for 1 year.  Yearly screening should continue until it has been 15 years since you quit.  Yearly screening should stop if you develop a health problem that would prevent you from having lung cancer treatment.  Breast Cancer  Practice breast self-awareness. This means understanding how your breasts normally appear and feel.  It also means doing regular breast self-exams. Let your health care provider know about any changes, no matter how small.  If you are in your 20s or 30s, you should have a clinical breast exam (CBE) by a health care provider every 1-3 years as part of a regular health exam.  If you are 76 or older, have a CBE every  year. Also consider having a breast X-ray (mammogram) every year.  If you have a family history of breast cancer, talk to your health care provider about genetic screening.  If you are at high risk for breast cancer, talk to your health care provider about having an MRI and a mammogram every year.  Breast cancer gene (BRCA) assessment is recommended for women who have family members with BRCA-related cancers. BRCA-related cancers include:  Breast.  Ovarian.  Tubal.  Peritoneal cancers.  Results of the assessment will determine the need for genetic counseling  and BRCA1 and BRCA2 testing. Cervical Cancer Your health care provider may recommend that you be screened regularly for cancer of the pelvic organs (ovaries, uterus, and vagina). This screening involves a pelvic examination, including checking for microscopic changes to the surface of your cervix (Pap test). You may be encouraged to have this screening done every 3 years, beginning at age 21.  For women ages 30-65, health care providers may recommend pelvic exams and Pap testing every 3 years, or they may recommend the Pap and pelvic exam, combined with testing for human papilloma virus (HPV), every 5 years. Some types of HPV increase your risk of cervical cancer. Testing for HPV may also be done on women of any age with unclear Pap test results.  Other health care providers may not recommend any screening for nonpregnant women who are considered low risk for pelvic cancer and who do not have symptoms. Ask your health care provider if a screening pelvic exam is right for you.  If you have had past treatment for cervical cancer or a condition that could lead to cancer, you need Pap tests and screening for cancer for at least 20 years after your treatment. If Pap tests have been discontinued, your risk factors (such as having a new sexual partner) need to be reassessed to determine if screening should resume. Some women have medical problems that increase the chance of getting cervical cancer. In these cases, your health care provider may recommend more frequent screening and Pap tests. Colorectal Cancer  This type of cancer can be detected and often prevented.  Routine colorectal cancer screening usually begins at 45 years of age and continues through 45 years of age.  Your health care provider may recommend screening at an earlier age if you have risk factors for colon cancer.  Your health care provider may also recommend using home test kits to check for hidden blood in the stool.  A small camera  at the end of a tube can be used to examine your colon directly (sigmoidoscopy or colonoscopy). This is done to check for the earliest forms of colorectal cancer.  Routine screening usually begins at age 50.  Direct examination of the colon should be repeated every 5-10 years through 45 years of age. However, you may need to be screened more often if early forms of precancerous polyps or small growths are found. Skin Cancer  Check your skin from head to toe regularly.  Tell your health care provider about any new moles or changes in moles, especially if there is a change in a mole's shape or color.  Also tell your health care provider if you have a mole that is larger than the size of a pencil eraser.  Always use sunscreen. Apply sunscreen liberally and repeatedly throughout the day.  Protect yourself by wearing long sleeves, pants, a wide-brimmed hat, and sunglasses whenever you are outside. HEART DISEASE, DIABETES, AND HIGH BLOOD PRESSURE     High blood pressure causes heart disease and increases the risk of stroke. High blood pressure is more likely to develop in:  People who have blood pressure in the high end of the normal range (130-139/85-89 mm Hg).  People who are overweight or obese.  People who are African American.  If you are 18-39 years of age, have your blood pressure checked every 3-5 years. If you are 40 years of age or older, have your blood pressure checked every year. You should have your blood pressure measured twice--once when you are at a hospital or clinic, and once when you are not at a hospital or clinic. Record the average of the two measurements. To check your blood pressure when you are not at a hospital or clinic, you can use:  An automated blood pressure machine at a pharmacy.  A home blood pressure monitor.  If you are between 55 years and 79 years old, ask your health care provider if you should take aspirin to prevent strokes.  Have regular diabetes  screenings. This involves taking a blood sample to check your fasting blood sugar level.  If you are at a normal weight and have a low risk for diabetes, have this test once every three years after 45 years of age.  If you are overweight and have a high risk for diabetes, consider being tested at a younger age or more often. PREVENTING INFECTION  Hepatitis B  If you have a higher risk for hepatitis B, you should be screened for this virus. You are considered at high risk for hepatitis B if:  You were born in a country where hepatitis B is common. Ask your health care provider which countries are considered high risk.  Your parents were born in a high-risk country, and you have not been immunized against hepatitis B (hepatitis B vaccine).  You have HIV or AIDS.  You use needles to inject street drugs.  You live with someone who has hepatitis B.  You have had sex with someone who has hepatitis B.  You get hemodialysis treatment.  You take certain medicines for conditions, including cancer, organ transplantation, and autoimmune conditions. Hepatitis C  Blood testing is recommended for:  Everyone born from 1945 through 1965.  Anyone with known risk factors for hepatitis C. Sexually transmitted infections (STIs)  You should be screened for sexually transmitted infections (STIs) including gonorrhea and chlamydia if:  You are sexually active and are younger than 45 years of age.  You are older than 45 years of age and your health care provider tells you that you are at risk for this type of infection.  Your sexual activity has changed since you were last screened and you are at an increased risk for chlamydia or gonorrhea. Ask your health care provider if you are at risk.  If you do not have HIV, but are at risk, it may be recommended that you take a prescription medicine daily to prevent HIV infection. This is called pre-exposure prophylaxis (PrEP). You are considered at risk  if:  You are sexually active and do not regularly use condoms or know the HIV status of your partner(s).  You take drugs by injection.  You are sexually active with a partner who has HIV. Talk with your health care provider about whether you are at high risk of being infected with HIV. If you choose to begin PrEP, you should first be tested for HIV. You should then be tested every 3 months for as   long as you are taking PrEP.  PREGNANCY   If you are premenopausal and you may become pregnant, ask your health care provider about preconception counseling.  If you may become pregnant, take 400 to 800 micrograms (mcg) of folic acid every day.  If you want to prevent pregnancy, talk to your health care provider about birth control (contraception). OSTEOPOROSIS AND MENOPAUSE   Osteoporosis is a disease in which the bones lose minerals and strength with aging. This can result in serious bone fractures. Your risk for osteoporosis can be identified using a bone density scan.  If you are 30 years of age or older, or if you are at risk for osteoporosis and fractures, ask your health care provider if you should be screened.  Ask your health care provider whether you should take a calcium or vitamin D supplement to lower your risk for osteoporosis.  Menopause may have certain physical symptoms and risks.  Hormone replacement therapy may reduce some of these symptoms and risks. Talk to your health care provider about whether hormone replacement therapy is right for you.  HOME CARE INSTRUCTIONS   Schedule regular health, dental, and eye exams.  Stay current with your immunizations.   Do not use any tobacco products including cigarettes, chewing tobacco, or electronic cigarettes.  If you are pregnant, do not drink alcohol.  If you are breastfeeding, limit how much and how often you drink alcohol.  Limit alcohol intake to no more than 1 drink per day for nonpregnant women. One drink equals 12  ounces of beer, 5 ounces of wine, or 1 ounces of hard liquor.  Do not use street drugs.  Do not share needles.  Ask your health care provider for help if you need support or information about quitting drugs.  Tell your health care provider if you often feel depressed.  Tell your health care provider if you have ever been abused or do not feel safe at home.   This information is not intended to replace advice given to you by your health care provider. Make sure you discuss any questions you have with your health care provider.   Document Released: 05/19/2011 Document Revised: 11/24/2014 Document Reviewed: 10/05/2013 Elsevier Interactive Patient Education [employer].

## 2015-09-05 NOTE — Progress Notes (Signed)
Pre visit review using our clinic review tool, if applicable. No additional management support is needed unless otherwise documented below in the visit note. 

## 2015-09-05 NOTE — Progress Notes (Signed)
BP 110/60 mmHg  Pulse 72  Temp(Src) 97.7 F (36.5 C) (Oral)  Ht 5\' 6"  (1.676 m)  Wt 183 lb 8 oz (83.235 kg)  BMI 29.63 kg/m2  LMP 09/01/2015   CC: CPE  Subjective:    Patient ID: Lindsey Proctor, female    DOB: 08/24/1971, 44 y.o.   MRN: [id number]  HPI: Lindsey Proctor is a 44 y.o. female presenting on 09/05/2015 for Annual Exam   Hypothyroidism - on levothyroxine 75mcg daily. Feels same sxs as those prior to treatment recurring. Extremely tired, has to take nap during lunch at work. Some hot flashes. Not menopausal. No other thyroid sxs.  Slipped on slip and slide 04/2015 - hit head and passed out. [facility]. Normal CT scan, dx with concussion.   Preventative: Well woman exam - OBGYN Dr. Arlyce DiceKaplan (08/30/2015). mirena removed.  Mammogram - 06/2014 normal. Flu shot at work  Tdap 2012 Seat belt use discussed Sunscreen use discussed.  Caffeine: 2 20oz caffeinated drinks/day (diet mountain dew) Lives with husband, 2 children (son and daughter), 1 dog and cat Occupation: customer service call center Activity: walking some Diet: fruits and vegetables daily, lots of water, red meat 4 times a week, fish rarely  Relevant past medical, surgical, family and social history reviewed and updated as indicated. Interim medical history since our last visit reviewed. Allergies and medications reviewed and updated. Current Outpatient Prescriptions on File Prior to Visit  Medication Sig  . acetaminophen (TYLENOL) 325 MG tablet Take 650 mg by mouth every 6 (six) hours as needed.    Marland Kitchen. albuterol (PROVENTIL HFA;VENTOLIN HFA) 108 (90 BASE) MCG/ACT inhaler Inhale 2 puffs into the lungs every 6 (six) hours as needed for wheezing or shortness of breath.  . Calcium Carbonate (CALTRATE 600 PO) Take 1 tablet by mouth daily.  . fluticasone (FLONASE) 50 MCG/ACT nasal spray Place 2 sprays into the nose daily.  Marland Kitchen. ibuprofen (ADVIL,MOTRIN) 200 MG tablet Take by mouth every 6 (six) hours as  needed.   Marland Kitchen. levothyroxine (SYNTHROID, LEVOTHROID) 75 MCG tablet Take 1 tablet (75 mcg total) by mouth daily before breakfast.  . Multiple Vitamin (MULTIVITAMIN) tablet Take 1 tablet by mouth daily.   No current facility-administered medications on file prior to visit.    Review of Systems  Constitutional: Negative for fever, chills, activity change, appetite change, fatigue and unexpected weight change.  HENT: Negative for hearing loss.   Eyes: Negative for visual disturbance.  Respiratory: Negative for cough, chest tightness, shortness of breath and wheezing.   Cardiovascular: Negative for chest pain, palpitations and leg swelling.  Gastrointestinal: Negative for nausea, vomiting, abdominal pain, diarrhea, constipation, blood in stool and abdominal distention.  Genitourinary: Negative for hematuria and difficulty urinating.  Musculoskeletal: Negative for myalgias, arthralgias and neck pain.  Skin: Negative for rash.  Neurological: Positive for syncope (see HPI). Negative for dizziness, seizures and headaches.  Hematological: Negative for adenopathy. Does not bruise/bleed easily.  Psychiatric/Behavioral: Negative for dysphoric mood. The patient is not nervous/anxious.    Per HPI unless specifically indicated above     Objective:    BP 110/60 mmHg  Pulse 72  Temp(Src) 97.7 F (36.5 C) (Oral)  Ht 5\' 6"  (1.676 m)  Wt 183 lb 8 oz (83.235 kg)  BMI 29.63 kg/m2  LMP 09/01/2015  Wt Readings from Last 3 Encounters:  09/05/15 183 lb 8 oz (83.235 kg)  04/12/15 191 lb (86.637 kg)  10/31/14 184 lb (83.462 kg)    Physical Exam  Constitutional: She is oriented to person, place, and time. She appears well-developed and well-nourished. No distress.  HENT:  Head: Normocephalic and atraumatic.  Right Ear: Hearing, tympanic membrane, external ear and ear canal normal.  Left Ear: Hearing, tympanic membrane, external ear and ear canal normal.  Nose: Nose normal.  Mouth/Throat: Uvula is  midline, oropharynx is clear and moist and mucous membranes are normal. No oropharyngeal exudate, posterior oropharyngeal edema or posterior oropharyngeal erythema.  Eyes: Conjunctivae and EOM are normal. Pupils are equal, round, and reactive to light. No scleral icterus.  Neck: Normal range of motion. Neck supple. No thyromegaly present.  Cardiovascular: Normal rate, regular rhythm, normal heart sounds and intact distal pulses.   No murmur heard. Pulses:      Radial pulses are 2+ on the right side, and 2+ on the left side.  Pulmonary/Chest: Effort normal and breath sounds normal. No respiratory distress. She has no wheezes. She has no rales.  Abdominal: Soft. Bowel sounds are normal. She exhibits no distension and no mass. There is no tenderness. There is no rebound and no guarding.  Genitourinary:  GYN - per GYN  Musculoskeletal: Normal range of motion. She exhibits no edema.  Lymphadenopathy:    She has no cervical adenopathy.  Neurological: She is alert and oriented to person, place, and time.  CN grossly intact, station and gait intact  Skin: Skin is warm and dry. No rash noted.  Psychiatric: She has a normal mood and affect. Her behavior is normal. Judgment and thought content normal.  Nursing note and vitals reviewed.     Assessment & Plan:   Problem List Items Addressed This Visit    Overweight with body mass index (BMI) 25.0-29.9    Discussed healthy diet and lifestyle changes to affect sustainable weight loss. Pt may try elliptical and diet changes      Mild hyperlipidemia    Check FLP today.      Relevant Orders   Lipid panel   Comprehensive metabolic panel   Hypothyroidism    Check TFTs today.      Relevant Orders   TSH   CBC with Differential/Platelet   T3   T4, free   Healthcare maintenance - Primary    Preventative protocols reviewed and updated unless pt declined. Discussed healthy diet and lifestyle.       Fatigue    Ongoing over last 1+ yr, prior  attributed to untreated hypothyroidism but sxs persist. Check for other reversible causes of fatigue today.      Relevant Orders   Comprehensive metabolic panel   TSH   CBC with Differential/Platelet   Vitamin B12   Vit D  25 hydroxy (rtn osteoporosis monitoring)       Follow up plan: No Follow-up on file.

## 2015-09-05 NOTE — Assessment & Plan Note (Signed)
Preventative protocols reviewed and updated unless pt declined. Discussed healthy diet and lifestyle.  

## 2015-09-05 NOTE — Assessment & Plan Note (Signed)
Ongoing over last 1+ yr, prior attributed to untreated hypothyroidism but sxs persist. Check for other reversible causes of fatigue today.

## 2015-09-05 NOTE — Assessment & Plan Note (Signed)
Check FLP today. 

## 2015-09-06 LAB — T3: T3, Total: 109.4 ng/dL (ref 80.0–204.0)

## 2015-09-08 ENCOUNTER — Other Ambulatory Visit: Payer: Self-pay | Admitting: Family Medicine

## 2015-09-08 ENCOUNTER — Encounter: Payer: Self-pay | Admitting: Family Medicine

## 2015-09-08 DIAGNOSIS — E559 Vitamin D deficiency, unspecified: Secondary | ICD-10-CM

## 2015-09-08 DIAGNOSIS — E538 Deficiency of other specified B group vitamins: Secondary | ICD-10-CM

## 2015-09-08 HISTORY — DX: Vitamin D deficiency, unspecified: E55.9

## 2015-09-08 HISTORY — DX: Deficiency of other specified B group vitamins: E53.8

## 2015-09-08 MED ORDER — VITAMIN D 50 MCG (2000 UT) PO CAPS: 1.0000 | ORAL_CAPSULE | Freq: Every day | ORAL | Status: DC

## 2015-09-10 ENCOUNTER — Encounter: Payer: Self-pay | Admitting: *Deleted

## 2015-09-21 ENCOUNTER — Telehealth: Payer: Self-pay | Admitting: Family Medicine

## 2015-09-21 MED ORDER — CYANOCOBALAMIN 1000 MCG/ML IJ SOLN: 1000.0000 ug | INTRAMUSCULAR | Status: DC

## 2015-09-21 MED ORDER — SYRINGE (DISPOSABLE) 3 ML MISC: Status: DC

## 2015-09-21 MED ORDER — "NEEDLE (DISP) 18G X 1"" MISC": Status: DC

## 2015-09-21 MED ORDER — "NEEDLE (DISP) 25G X 1"" MISC": Status: DC

## 2015-09-21 NOTE — Telephone Encounter (Signed)
Spoke with patient. She has a friend that is a [employer] fundraiserN and has offered to give her the B12 injections at home. Trying to get here monthly would be very difficult for patient. Sent in med and supplies to pharmacy. Advised to call with any questions or problems.

## 2015-09-21 NOTE — Telephone Encounter (Signed)
Pt would like a call back regarding the letter she received in the mail

## 2016-02-16 LAB — HM MAMMOGRAPHY: HM Mammogram: NORMAL (ref 0–4)

## 2016-09-16 ENCOUNTER — Encounter: Payer: Self-pay | Admitting: Family Medicine

## 2016-09-16 ENCOUNTER — Ambulatory Visit (INDEPENDENT_AMBULATORY_CARE_PROVIDER_SITE_OTHER): Payer: Commercial Managed Care - PPO | Admitting: Family Medicine

## 2016-09-16 VITALS — BP 122/84 | HR 76 | Temp 98.0°F | Ht 66.0 in | Wt 184.8 lb

## 2016-09-16 DIAGNOSIS — E785 Hyperlipidemia, unspecified: Secondary | ICD-10-CM | POA: Diagnosis not present

## 2016-09-16 DIAGNOSIS — R053 Chronic cough: Secondary | ICD-10-CM

## 2016-09-16 DIAGNOSIS — Z0000 Encounter for general adult medical examination without abnormal findings: Secondary | ICD-10-CM | POA: Diagnosis not present

## 2016-09-16 DIAGNOSIS — E559 Vitamin D deficiency, unspecified: Secondary | ICD-10-CM | POA: Diagnosis not present

## 2016-09-16 DIAGNOSIS — E663 Overweight: Secondary | ICD-10-CM

## 2016-09-16 DIAGNOSIS — N951 Menopausal and female climacteric states: Secondary | ICD-10-CM

## 2016-09-16 DIAGNOSIS — R05 Cough: Secondary | ICD-10-CM

## 2016-09-16 DIAGNOSIS — E538 Deficiency of other specified B group vitamins: Secondary | ICD-10-CM

## 2016-09-16 DIAGNOSIS — Z780 Asymptomatic menopausal state: Secondary | ICD-10-CM | POA: Insufficient documentation

## 2016-09-16 DIAGNOSIS — E039 Hypothyroidism, unspecified: Secondary | ICD-10-CM | POA: Diagnosis not present

## 2016-09-16 LAB — BASIC METABOLIC PANEL
BUN: 18 mg/dL (ref 6–23)
CHLORIDE: 108 meq/L (ref 96–112)
CO2: 26 mEq/L (ref 19–32)
CREATININE: 0.67 mg/dL (ref 0.40–1.20)
Calcium: 9.5 mg/dL (ref 8.4–10.5)
GFR: 100.68 mL/min (ref >60.00)
Glucose, Bld: 95 mg/dL (ref 70–99)
Potassium: 3.8 mEq/L (ref 3.5–5.1)
Sodium: 142 mEq/L (ref 135–145)

## 2016-09-16 LAB — VITAMIN B12: Vitamin B-12: 372 pg/mL (ref 211–911)

## 2016-09-16 LAB — LIPID PANEL
CHOLESTEROL: 211 mg/dL — AB (ref 0–200)
HDL: 40.9 mg/dL (ref >39.00)
LDL CALC: 151 mg/dL — AB (ref 0–99)
NonHDL: 169.84
TRIGLYCERIDES: 94 mg/dL (ref 0.0–149.0)
Total CHOL/HDL Ratio: 5
VLDL: 18.8 mg/dL (ref 0.0–40.0)

## 2016-09-16 LAB — VITAMIN D 25 HYDROXY (VIT D DEFICIENCY, FRACTURES): VITD: 16.32 ng/mL — ABNORMAL LOW (ref 30.00–100.00)

## 2016-09-16 LAB — T4, FREE: FREE T4: 0.62 ng/dL (ref 0.60–1.60)

## 2016-09-16 LAB — TSH: TSH: 2.36 u[IU]/mL (ref 0.35–4.50)

## 2016-09-16 NOTE — Assessment & Plan Note (Signed)
Preventative protocols reviewed and updated unless pt declined. Discussed healthy diet and lifestyle.  

## 2016-09-16 NOTE — Progress Notes (Signed)
Pre visit review using our clinic review tool, if applicable. No additional management support is needed unless otherwise documented below in the visit note. 

## 2016-09-16 NOTE — Assessment & Plan Note (Signed)
Discussed healthy diet and lifestyle changes to affect sustainable weight loss  

## 2016-09-16 NOTE — Assessment & Plan Note (Signed)
Reviewed symptoms with patient.

## 2016-09-16 NOTE — Assessment & Plan Note (Signed)
Has been receiving b12 shots through 18g needle - reviewed need to use 25g needle.  She has been off B12 over last few months. Recheck levels today.

## 2016-09-16 NOTE — Assessment & Plan Note (Signed)
Update FLP today.  

## 2016-09-16 NOTE — Assessment & Plan Note (Signed)
Pt stopped levothyroxine last year due to misunderstanding. Check TFTs and likely restart levothyroxine daily.

## 2016-09-16 NOTE — Assessment & Plan Note (Signed)
Discussed importance of 2000 IU compliance. Recheck today, if staying low consider weekly supplementation.

## 2016-09-16 NOTE — Assessment & Plan Note (Signed)
Stable on singulair. Did not fill albuterol inhaler - too expensive.

## 2016-09-16 NOTE — Patient Instructions (Addendum)
Labs today We will be in touch with results and may restart thyroid medication  Return as needed or in 1 year for next physical.  Health Maintenance, Female Adopting a healthy lifestyle and getting preventive care can go a long way to promote health and wellness. Talk with your health care provider about what schedule of regular examinations is right for you. This is a good chance for you to check in with your provider about disease prevention and staying healthy. In between checkups, there are plenty of things you can do on your own. Experts have done a lot of research about which lifestyle changes and preventive measures are most likely to keep you healthy. Ask your health care provider for more information. WEIGHT AND DIET  Eat a healthy diet  Be sure to include plenty of vegetables, fruits, low-fat dairy products, and lean protein.  Do not eat a lot of foods high in solid fats, added sugars, or salt.  Get regular exercise. This is one of the most important things you can do for your health.  Most adults should exercise for at least 150 minutes each week. The exercise should increase your heart rate and make you sweat (moderate-intensity exercise).  Most adults should also do strengthening exercises at least twice a week. This is in addition to the moderate-intensity exercise.  Maintain a healthy weight  Body mass index (BMI) is a measurement that can be used to identify possible weight problems. It estimates body fat based on height and weight. Your health care provider can help determine your BMI and help you achieve or maintain a healthy weight.  For females 72 years of age and older:   A BMI below 18.5 is considered underweight.  A BMI of 18.5 to 24.9 is normal.  A BMI of 25 to 29.9 is considered overweight.  A BMI of 30 and above is considered obese.  Watch levels of cholesterol and blood lipids  You should start having your blood tested for lipids and cholesterol at 46  years of age, then have this test every 5 years.  You may need to have your cholesterol levels checked more often if:  Your lipid or cholesterol levels are high.  You are older than 46 years of age.  You are at high risk for heart disease.  CANCER SCREENING   Lung Cancer  Lung cancer screening is recommended for adults 51-85 years old who are at high risk for lung cancer because of a history of smoking.  A yearly low-dose CT scan of the lungs is recommended for people who:  Currently smoke.  Have quit within the past 15 years.  Have at least a 30-pack-year history of smoking. A pack year is smoking an average of one pack of cigarettes a day for 1 year.  Yearly screening should continue until it has been 15 years since you quit.  Yearly screening should stop if you develop a health problem that would prevent you from having lung cancer treatment.  Breast Cancer  Practice breast self-awareness. This means understanding how your breasts normally appear and feel.  It also means doing regular breast self-exams. Let your health care provider know about any changes, no matter how small.  If you are in your 20s or 30s, you should have a clinical breast exam (CBE) by a health care provider every 1-3 years as part of a regular health exam.  If you are 54 or older, have a CBE every year. Also consider having a breast  X-ray (mammogram) every year.  If you have a family history of breast cancer, talk to your health care provider about genetic screening.  If you are at high risk for breast cancer, talk to your health care provider about having an MRI and a mammogram every year.  Breast cancer gene (BRCA) assessment is recommended for women who have family members with BRCA-related cancers. BRCA-related cancers include:  Breast.  Ovarian.  Tubal.  Peritoneal cancers.  Results of the assessment will determine the need for genetic counseling and BRCA1 and BRCA2 testing. Cervical  Cancer Your health care provider may recommend that you be screened regularly for cancer of the pelvic organs (ovaries, uterus, and vagina). This screening involves a pelvic examination, including checking for microscopic changes to the surface of your cervix (Pap test). You may be encouraged to have this screening done every 3 years, beginning at age 59.  For women ages 49-65, health care providers may recommend pelvic exams and Pap testing every 3 years, or they may recommend the Pap and pelvic exam, combined with testing for human papilloma virus (HPV), every 5 years. Some types of HPV increase your risk of cervical cancer. Testing for HPV may also be done on women of any age with unclear Pap test results.  Other health care providers may not recommend any screening for nonpregnant women who are considered low risk for pelvic cancer and who do not have symptoms. Ask your health care provider if a screening pelvic exam is right for you.  If you have had past treatment for cervical cancer or a condition that could lead to cancer, you need Pap tests and screening for cancer for at least 20 years after your treatment. If Pap tests have been discontinued, your risk factors (such as having a new sexual partner) need to be reassessed to determine if screening should resume. Some women have medical problems that increase the chance of getting cervical cancer. In these cases, your health care provider may recommend more frequent screening and Pap tests. Colorectal Cancer  This type of cancer can be detected and often prevented.  Routine colorectal cancer screening usually begins at 46 years of age and continues through 46 years of age.  Your health care provider may recommend screening at an earlier age if you have risk factors for colon cancer.  Your health care provider may also recommend using home test kits to check for hidden blood in the stool.  A small camera at the end of a tube can be used to  examine your colon directly (sigmoidoscopy or colonoscopy). This is done to check for the earliest forms of colorectal cancer.  Routine screening usually begins at age 12.  Direct examination of the colon should be repeated every 5-10 years through 46 years of age. However, you may need to be screened more often if early forms of precancerous polyps or small growths are found. Skin Cancer  Check your skin from head to toe regularly.  Tell your health care provider about any new moles or changes in moles, especially if there is a change in a mole's shape or color.  Also tell your health care provider if you have a mole that is larger than the size of a pencil eraser.  Always use sunscreen. Apply sunscreen liberally and repeatedly throughout the day.  Protect yourself by wearing long sleeves, pants, a wide-brimmed hat, and sunglasses whenever you are outside. HEART DISEASE, DIABETES, AND HIGH BLOOD PRESSURE   High blood pressure causes heart  disease and increases the risk of stroke. High blood pressure is more likely to develop in:  People who have blood pressure in the high end of the normal range (130-139/85-89 mm Hg).  People who are overweight or obese.  People who are African American.  If you are 46-25 years of age, have your blood pressure checked every 3-5 years. If you are 69 years of age or older, have your blood pressure checked every year. You should have your blood pressure measured twice--once when you are at a hospital or clinic, and once when you are not at a hospital or clinic. Record the average of the two measurements. To check your blood pressure when you are not at a hospital or clinic, you can use:  An automated blood pressure machine at a pharmacy.  A home blood pressure monitor.  If you are between 82 years and 75 years old, ask your health care provider if you should take aspirin to prevent strokes.  Have regular diabetes screenings. This involves taking a  blood sample to check your fasting blood sugar level.  If you are at a normal weight and have a low risk for diabetes, have this test once every three years after 46 years of age.  If you are overweight and have a high risk for diabetes, consider being tested at a younger age or more often. PREVENTING INFECTION  Hepatitis B  If you have a higher risk for hepatitis B, you should be screened for this virus. You are considered at high risk for hepatitis B if:  You were born in a country where hepatitis B is common. Ask your health care provider which countries are considered high risk.  Your parents were born in a high-risk country, and you have not been immunized against hepatitis B (hepatitis B vaccine).  You have HIV or AIDS.  You use needles to inject street drugs.  You live with someone who has hepatitis B.  You have had sex with someone who has hepatitis B.  You get hemodialysis treatment.  You take certain medicines for conditions, including cancer, organ transplantation, and autoimmune conditions. Hepatitis C  Blood testing is recommended for:  Everyone born from [birth year] through 1966.  Anyone with known risk factors for hepatitis C. Sexually transmitted infections (STIs)  You should be screened for sexually transmitted infections (STIs) including gonorrhea and chlamydia if:  You are sexually active and are younger than 46 years of age.  You are older than 46 years of age and your health care provider tells you that you are at risk for this type of infection.  Your sexual activity has changed since you were last screened and you are at an increased risk for chlamydia or gonorrhea. Ask your health care provider if you are at risk.  If you do not have HIV, but are at risk, it may be recommended that you take a prescription medicine daily to prevent HIV infection. This is called pre-exposure prophylaxis (PrEP). You are considered at risk if:  You are sexually active and do  not regularly use condoms or know the HIV status of your partner(s).  You take drugs by injection.  You are sexually active with a partner who has HIV. Talk with your health care provider about whether you are at high risk of being infected with HIV. If you choose to begin PrEP, you should first be tested for HIV. You should then be tested every 3 months for as long as you are taking  PrEP.  PREGNANCY   If you are premenopausal and you may become pregnant, ask your health care provider about preconception counseling.  If you may become pregnant, take 400 to 800 micrograms (mcg) of folic acid every day.  If you want to prevent pregnancy, talk to your health care provider about birth control (contraception). OSTEOPOROSIS AND MENOPAUSE   Osteoporosis is a disease in which the bones lose minerals and strength with aging. This can result in serious bone fractures. Your risk for osteoporosis can be identified using a bone density scan.  If you are 12 years of age or older, or if you are at risk for osteoporosis and fractures, ask your health care provider if you should be screened.  Ask your health care provider whether you should take a calcium or vitamin D supplement to lower your risk for osteoporosis.  Menopause may have certain physical symptoms and risks.  Hormone replacement therapy may reduce some of these symptoms and risks. Talk to your health care provider about whether hormone replacement therapy is right for you.  HOME CARE INSTRUCTIONS   Schedule regular health, dental, and eye exams.  Stay current with your immunizations.   Do not use any tobacco products including cigarettes, chewing tobacco, or electronic cigarettes.  If you are pregnant, do not drink alcohol.  If you are breastfeeding, limit how much and how often you drink alcohol.  Limit alcohol intake to no more than 1 drink per day for nonpregnant women. One drink equals 12 ounces of beer, 5 ounces of wine, or 1  ounces of hard liquor.  Do not use street drugs.  Do not share needles.  Ask your health care provider for help if you need support or information about quitting drugs.  Tell your health care provider if you often feel depressed.  Tell your health care provider if you have ever been abused or do not feel safe at home.   This information is not intended to replace advice given to you by your health care provider. Make sure you discuss any questions you have with your health care provider.   Document Released: 05/19/2011 Document Revised: 11/24/2014 Document Reviewed: 10/05/2013 Elsevier Interactive Patient Education [employer].

## 2016-09-16 NOTE — Progress Notes (Signed)
BP 122/84   Pulse 76   Temp 98 F (36.7 C) (Oral)   Ht 5\' 6"  (1.676 m)   Wt 184 lb 12 oz (83.8 kg)   BMI 29.82 kg/m    CC: CPE Subjective:    Patient ID: Lindsey Proctor, female    DOB: 1970-12-20, 45 y.o.   MRN: [id number]  HPI: Lindsey Proctor is a 45 y.o. female presenting on 09/16/2016 for No chief complaint on file.   Hypothyroidism - pt accidentally stopped levothyroxine last year.  B12 deficiency - pt has not taken in last 2 months.  Chronic cough - singulair has helped.  Noticing increasing mood swings and hot flashes. Tried amberen - not helpful.   Preventative: Well woman exam - OBGYN Dr. Arlyce Proctor (07/31/2015). mirena removed 07/2015.  Mammogram - 07/2015 normal.  Flu shot at work  Tdap 2012 LMP - early September - more irregular Seat belt use discussed Sunscreen use discussed. No changing moles on skin Non smoker Alcohol - none  Caffeine: 2 20oz caffeinated drinks/day (diet mountain dew) Lives with husband, 2 children (son and daughter), 1 dog and cat Occupation: customer service call center Activity: walking daily Diet: fruits and vegetables daily, lots of water, red meat 4 times a week, fish rarely, decreasing carbs  Relevant past medical, surgical, family and social history reviewed and updated as indicated. Interim medical history since our last visit reviewed. Allergies and medications reviewed and updated. Current Outpatient Prescriptions on File Prior to Visit  Medication Sig  . acetaminophen (TYLENOL) 325 MG tablet Take 650 mg by mouth every 6 (six) hours as needed.    . Calcium Carbonate (CALTRATE 600 PO) Take 1 tablet by mouth daily.  . Cholecalciferol (VITAMIN D) 2000 UNITS CAPS Take 1 capsule (2,000 Units total) by mouth daily.  . fluticasone (FLONASE) 50 MCG/ACT nasal spray Place 2 sprays into the nose daily.  Marland Kitchen ibuprofen (ADVIL,MOTRIN) 200 MG tablet Take by mouth every 6 (six) hours as needed.   . montelukast (SINGULAIR) 10 MG tablet Take 1  tablet (10 mg total) by mouth at bedtime.  . Multiple Vitamin (MULTIVITAMIN) tablet Take 1 tablet by mouth daily.  Marland Kitchen NEEDLE, DISP, 18 G 18G X 1" MISC Use to DRAW UP B12 solution every 30 days as directed.  Marland Kitchen NEEDLE, DISP, 25 G 25G X 1" MISC Use to INJECT B12 solution IM every 30 days as directed.  . Syringe, Disposable, 3 ML MISC Use to inject B12 as directed.  . cyanocobalamin (,VITAMIN B-12,) 1000 MCG/ML injection Inject 1 mL (1,000 mcg total) into the muscle every 30 (thirty) days. (Patient not taking: Reported on 09/16/2016)  . levothyroxine (SYNTHROID, LEVOTHROID) 75 MCG tablet Take 1 tablet (75 mcg total) by mouth daily before breakfast. (Patient not taking: Reported on 09/16/2016)   No current facility-administered medications on file prior to visit.     Review of Systems  Constitutional: Negative for activity change, appetite change, chills, fatigue, fever and unexpected weight change.  HENT: Negative for hearing loss.   Eyes: Negative for visual disturbance.  Respiratory: Negative for cough, chest tightness, shortness of breath and wheezing.   Cardiovascular: Negative for chest pain, palpitations and leg swelling.  Gastrointestinal: Negative for abdominal distention, abdominal pain, blood in stool, constipation, diarrhea, nausea and vomiting.  Genitourinary: Negative for difficulty urinating and hematuria.  Musculoskeletal: Negative for arthralgias, myalgias and neck pain.  Skin: Negative for rash.  Neurological: Negative for dizziness, seizures, syncope and headaches.  Hematological: Negative for adenopathy.  Does not bruise/bleed easily.  Psychiatric/Behavioral: Negative for dysphoric mood. The patient is not nervous/anxious.    Per HPI unless specifically indicated in ROS section     Objective:    BP 122/84   Pulse 76   Temp 98 F (36.7 C) (Oral)   Ht 5\' 6"  (1.676 m)   Wt 184 lb 12 oz (83.8 kg)   BMI 29.82 kg/m   Wt Readings from Last 3 Encounters:  09/16/16 184 lb  12 oz (83.8 kg)  09/05/15 183 lb 8 oz (83.2 kg)  04/12/15 191 lb (86.6 kg)    Physical Exam  Constitutional: She is oriented to person, place, and time. She appears well-developed and well-nourished. No distress.  HENT:  Head: Normocephalic and atraumatic.  Right Ear: Hearing, tympanic membrane, external ear and ear canal normal.  Left Ear: Hearing, tympanic membrane, external ear and ear canal normal.  Nose: Nose normal.  Mouth/Throat: Uvula is midline, oropharynx is clear and moist and mucous membranes are normal. No oropharyngeal exudate, posterior oropharyngeal edema or posterior oropharyngeal erythema.  Eyes: Conjunctivae and EOM are normal. Pupils are equal, round, and reactive to light. No scleral icterus.  Neck: Normal range of motion. Neck supple. No thyromegaly present.  Cardiovascular: Normal rate, regular rhythm, normal heart sounds and intact distal pulses.   No murmur heard. Pulses:      Radial pulses are 2+ on the right side, and 2+ on the left side.  Pulmonary/Chest: Effort normal and breath sounds normal. No respiratory distress. She has no wheezes. She has no rales.  Abdominal: Soft. Bowel sounds are normal. She exhibits no distension and no mass. There is no tenderness. There is no rebound and no guarding.  Musculoskeletal: Normal range of motion. She exhibits no edema.  Lymphadenopathy:    She has no cervical adenopathy.  Neurological: She is alert and oriented to person, place, and time.  CN grossly intact, station and gait intact  Skin: Skin is warm and dry. No rash noted.  Psychiatric: She has a normal mood and affect. Her behavior is normal. Judgment and thought content normal.  Nursing note and vitals reviewed.  Results for orders placed or performed in visit on 09/16/16  HM MAMMOGRAPHY  Result Value Ref Range   HM Mammogram Self Reported Normal 0-4 Bi-Rad, Self Reported Normal  HM PAP SMEAR  Result Value Ref Range   HM Pap smear normal per patient         Assessment & Plan:   Problem List Items Addressed This Visit    Chronic cough    Stable on singulair. Did not fill albuterol inhaler - too expensive.      Healthcare maintenance - Primary    Preventative protocols reviewed and updated unless pt declined. Discussed healthy diet and lifestyle.       Hypothyroidism    Pt stopped levothyroxine last year due to misunderstanding. Check TFTs and likely restart levothyroxine 75mcg daily.      Relevant Orders   TSH   T4, free   Mild hyperlipidemia    Update FLP today.      Relevant Orders   Lipid panel   Basic metabolic panel   Overweight with body mass index (BMI) 25.0-29.9    Discussed healthy diet and lifestyle changes to affect sustainable weight loss.      Perimenopausal    Reviewed symptoms with patient.      Vitamin B12 deficiency    Has been receiving b12 shots through 18g needle -  reviewed need to use 25g needle.  She has been off B12 over last few months. Recheck levels today.       Relevant Orders   Vitamin B12   Intrinsic Factor Antibodies   Vitamin D deficiency    Discussed importance of 2000 IU compliance. Recheck today, if staying low consider weekly supplementation.      Relevant Orders   VITAMIN D 25 Hydroxy (Vit-D Deficiency, Fractures)    Other Visit Diagnoses   None.      Follow up plan: Return in about 1 year (around 09/16/2017) for annual exam, prior fasting for blood work.  Eustaquio BoydenJavier Tekelia Kareem, MD

## 2016-09-20 ENCOUNTER — Other Ambulatory Visit: Payer: Self-pay | Admitting: Family Medicine

## 2016-09-20 LAB — INTRINSIC FACTOR ANTIBODIES: INTRINSIC FACTOR: NEGATIVE

## 2016-09-20 MED ORDER — VITAMIN D3 1.25 MG (50000 UT) PO TABS: 1.0000 | ORAL_TABLET | ORAL | 1 refills | Status: DC

## 2016-09-23 ENCOUNTER — Other Ambulatory Visit: Payer: Self-pay | Admitting: *Deleted

## 2016-09-23 ENCOUNTER — Telehealth: Payer: Self-pay | Admitting: Family Medicine

## 2016-09-23 MED ORDER — "NEEDLE (DISP) 25G X 1"" MISC": 0 refills | Status: DC

## 2016-09-23 MED ORDER — "NEEDLE (DISP) 18G X 1"" MISC": 0 refills | Status: DC

## 2016-09-23 MED ORDER — SYRINGE (DISPOSABLE) 3 ML MISC: 0 refills | Status: DC

## 2016-09-23 NOTE — Telephone Encounter (Signed)
Spoke with patient.

## 2016-09-23 NOTE — Telephone Encounter (Signed)
Pt returned CMA call regarding lab results.  Best number is w# [phone]

## 2016-10-14 ENCOUNTER — Other Ambulatory Visit: Payer: Self-pay | Admitting: Family Medicine

## 2016-10-16 ENCOUNTER — Other Ambulatory Visit: Payer: Self-pay | Admitting: Family Medicine

## 2017-01-06 ENCOUNTER — Telehealth: Payer: Self-pay | Admitting: Family Medicine

## 2017-01-06 MED ORDER — OSELTAMIVIR PHOSPHATE 75 MG PO CAPS: 75.0000 mg | ORAL_CAPSULE | Freq: Every day | ORAL | 0 refills | Status: DC

## 2017-01-06 NOTE — Telephone Encounter (Signed)
Husband sent mychart message - child dx with flu - requests ppx tamiflu. Sent in.

## 2017-03-18 ENCOUNTER — Other Ambulatory Visit: Payer: Self-pay | Admitting: Family Medicine

## 2017-03-18 DIAGNOSIS — E538 Deficiency of other specified B group vitamins: Secondary | ICD-10-CM

## 2017-03-18 DIAGNOSIS — E039 Hypothyroidism, unspecified: Secondary | ICD-10-CM

## 2017-03-18 DIAGNOSIS — E559 Vitamin D deficiency, unspecified: Secondary | ICD-10-CM

## 2017-03-20 ENCOUNTER — Other Ambulatory Visit (INDEPENDENT_AMBULATORY_CARE_PROVIDER_SITE_OTHER): Payer: Commercial Managed Care - PPO

## 2017-03-20 DIAGNOSIS — E039 Hypothyroidism, unspecified: Secondary | ICD-10-CM | POA: Diagnosis not present

## 2017-03-20 DIAGNOSIS — E559 Vitamin D deficiency, unspecified: Secondary | ICD-10-CM | POA: Diagnosis not present

## 2017-03-20 DIAGNOSIS — E538 Deficiency of other specified B group vitamins: Secondary | ICD-10-CM | POA: Diagnosis not present

## 2017-03-20 LAB — TSH: TSH: 1.92 u[IU]/mL (ref 0.35–4.50)

## 2017-03-20 LAB — VITAMIN D 25 HYDROXY (VIT D DEFICIENCY, FRACTURES): VITD: 59.38 ng/mL (ref 30.00–100.00)

## 2017-03-20 LAB — T4, FREE: FREE T4: 0.67 ng/dL (ref 0.60–1.60)

## 2017-03-20 LAB — VITAMIN B12: Vitamin B-12: 1040 pg/mL — ABNORMAL HIGH (ref 211–911)

## 2017-03-25 ENCOUNTER — Encounter: Payer: Self-pay | Admitting: Family Medicine

## 2017-08-28 ENCOUNTER — Ambulatory Visit (INDEPENDENT_AMBULATORY_CARE_PROVIDER_SITE_OTHER): Payer: Commercial Managed Care - PPO | Admitting: Family Medicine

## 2017-08-28 ENCOUNTER — Encounter: Payer: Self-pay | Admitting: Family Medicine

## 2017-08-28 VITALS — BP 118/78 | HR 63 | Temp 97.6°F | Ht 64.5 in | Wt 188.0 lb

## 2017-08-28 DIAGNOSIS — E559 Vitamin D deficiency, unspecified: Secondary | ICD-10-CM

## 2017-08-28 DIAGNOSIS — E785 Hyperlipidemia, unspecified: Secondary | ICD-10-CM | POA: Diagnosis not present

## 2017-08-28 DIAGNOSIS — E538 Deficiency of other specified B group vitamins: Secondary | ICD-10-CM | POA: Diagnosis not present

## 2017-08-28 DIAGNOSIS — E66811 Obesity, class 1: Secondary | ICD-10-CM

## 2017-08-28 DIAGNOSIS — Z23 Encounter for immunization: Secondary | ICD-10-CM | POA: Diagnosis not present

## 2017-08-28 DIAGNOSIS — E039 Hypothyroidism, unspecified: Secondary | ICD-10-CM | POA: Diagnosis not present

## 2017-08-28 DIAGNOSIS — E669 Obesity, unspecified: Secondary | ICD-10-CM | POA: Diagnosis not present

## 2017-08-28 DIAGNOSIS — Z780 Asymptomatic menopausal state: Secondary | ICD-10-CM | POA: Diagnosis not present

## 2017-08-28 DIAGNOSIS — Z0000 Encounter for general adult medical examination without abnormal findings: Secondary | ICD-10-CM | POA: Diagnosis not present

## 2017-08-28 LAB — LIPID PANEL
CHOL/HDL RATIO: 5
CHOLESTEROL: 215 mg/dL — AB (ref 0–200)
HDL: 40.2 mg/dL (ref >39.00)
LDL Cholesterol: 148 mg/dL — ABNORMAL HIGH (ref 0–99)
NonHDL: 175.13
TRIGLYCERIDES: 138 mg/dL (ref 0.0–149.0)
VLDL: 27.6 mg/dL (ref 0.0–40.0)

## 2017-08-28 LAB — BASIC METABOLIC PANEL
BUN: 15 mg/dL (ref 6–23)
CHLORIDE: 107 meq/L (ref 96–112)
CO2: 29 mEq/L (ref 19–32)
Calcium: 8.7 mg/dL (ref 8.4–10.5)
Creatinine, Ser: 0.72 mg/dL (ref 0.40–1.20)
GFR: 92.28 mL/min (ref >60.00)
GLUCOSE: 103 mg/dL — AB (ref 70–99)
POTASSIUM: 4 meq/L (ref 3.5–5.1)
Sodium: 142 mEq/L (ref 135–145)

## 2017-08-28 LAB — VITAMIN D 25 HYDROXY (VIT D DEFICIENCY, FRACTURES): VITD: 22.27 ng/mL — ABNORMAL LOW (ref 30.00–100.00)

## 2017-08-28 LAB — T4, FREE: Free T4: 0.66 ng/dL (ref 0.60–1.60)

## 2017-08-28 LAB — VITAMIN B12: Vitamin B-12: 330 pg/mL (ref 211–911)

## 2017-08-28 LAB — TSH: TSH: 2.75 u[IU]/mL (ref 0.35–4.50)

## 2017-08-28 NOTE — Progress Notes (Signed)
BP 118/78 (BP Location: Left Arm, Patient Position: Sitting, Cuff Size: Normal)   Pulse 63   Temp 97.6 F (36.4 C) (Oral)   Ht 5' 4.5" (1.638 m)   Wt 188 lb (85.3 kg)   LMP  (Within Years) Comment: 08/2015 was last after having Mirena removed  SpO2 98%   BMI 31.77 kg/m    CC: CPE Subjective:    Patient ID: Lindsey Proctor, female    DOB: 04/16/1971, 46 y.o.   MRN: [id number]  HPI: Lindsey Proctor is a 46 y.o. female presenting on 08/28/2017 for Annual Exam   Had been taking hydroxycut. She stopped this after she developed L leg pain. Saw [facility] for this, treated with stretching exercises.   Has been able to titrate off thyroid replacement medication.   Preventative: Well woman exam - OBGYN Dr. Arlyce Dice (02/2016). Q2 yrs. mirena removed 07/2015. LMP - 08/2015. Hot flashes. Discussed HRT. She does well with calcium in diet. Not taking any cal / vit D supplement Mammogram - 02/2016 normal at GYN office.  Flu shot yearly Tdap 2012 Seat belt use discussed.  Sunscreen use discussed. No changing moles on skin Non smoker Alcohol - none  Caffeine: 2 20oz caffeinated drinks/day (diet mountain dew) Lives with husband, 2 children (son and daughter), 1 dog and cat Occupation: customer service call center Activity: walking daily  Diet: fruits and vegetables daily, lots of water, red meat 4 times a week, fish rarely, decreasing carbs  Relevant past medical, surgical, family and social history reviewed and updated as indicated. Interim medical history since our last visit reviewed. Allergies and medications reviewed and updated. Outpatient Medications Prior to Visit  Medication Sig Dispense Refill  . fluticasone (FLONASE) 50 MCG/ACT nasal spray Place 2 sprays into the nose daily. 16 g 2  . montelukast (SINGULAIR) 10 MG tablet TAKE ONE TABLET BY MOUTH AT BEDTIME 30 tablet 6  . acetaminophen (TYLENOL) 325 MG tablet Take 650 mg by mouth every 6 (six) hours as needed.      . Calcium Carbonate  (CALTRATE 600 PO) Take 1 tablet by mouth daily.    . Cholecalciferol (VITAMIN D) 2000 UNITS CAPS Take 1 capsule (2,000 Units total) by mouth daily. 30 capsule   . cyanocobalamin (,VITAMIN B-12,) 1000 MCG/ML injection INJECT 1 ML INTO THE MUSCLE EVERY 30 DAYS. 3 mL 11  . ibuprofen (ADVIL,MOTRIN) 200 MG tablet Take by mouth every 6 (six) hours as needed.     . Multiple Vitamin (MULTIVITAMIN) tablet Take 1 tablet by mouth daily.    Marland Kitchen NEEDLE, DISP, 18 G 18G X 1" MISC Use to DRAW UP B12 solution every 30 days as directed. 12 each 0  . NEEDLE, DISP, 25 G 25G X 1" MISC Use to INJECT B12 solution IM every 30 days as directed. 12 each 0  . Syringe, Disposable, 3 ML MISC Use to inject B12 as directed. 12 each 0   No facility-administered medications prior to visit.      Per HPI unless specifically indicated in ROS section below Review of Systems  Constitutional: Negative for activity change, appetite change, chills, fatigue, fever and unexpected weight change.  HENT: Negative for hearing loss.   Eyes: Negative for visual disturbance.  Respiratory: Negative for cough, chest tightness, shortness of breath and wheezing.   Cardiovascular: Negative for chest pain, palpitations and leg swelling.  Gastrointestinal: Negative for abdominal distention, abdominal pain, blood in stool, constipation, diarrhea, nausea and vomiting.  Genitourinary: Negative for difficulty urinating  and hematuria.  Musculoskeletal: Negative for arthralgias, myalgias and neck pain.  Skin: Negative for rash.  Neurological: Negative for dizziness, seizures, syncope and headaches.  Hematological: Negative for adenopathy. Does not bruise/bleed easily.  Psychiatric/Behavioral: Negative for dysphoric mood. The patient is not nervous/anxious.        Increasing mood swings       Objective:    BP 118/78 (BP Location: Left Arm, Patient Position: Sitting, Cuff Size: Normal)   Pulse 63   Temp 97.6 F (36.4 C) (Oral)   Ht 5' 4.5" (1.638  m)   Wt 188 lb (85.3 kg)   LMP  (Within Years) Comment: 08/2015 was last after having Mirena removed  SpO2 98%   BMI 31.77 kg/m   Wt Readings from Last 3 Encounters:  08/28/17 188 lb (85.3 kg)  09/16/16 184 lb 12 oz (83.8 kg)  09/05/15 183 lb 8 oz (83.2 kg)    Physical Exam  Constitutional: She is oriented to person, place, and time. She appears well-developed and well-nourished. No distress.  HENT:  Head: Normocephalic and atraumatic.  Right Ear: Hearing, tympanic membrane, external ear and ear canal normal.  Left Ear: Hearing, tympanic membrane, external ear and ear canal normal.  Nose: Nose normal.  Mouth/Throat: Uvula is midline, oropharynx is clear and moist and mucous membranes are normal. No oropharyngeal exudate, posterior oropharyngeal edema or posterior oropharyngeal erythema.  Eyes: Pupils are equal, round, and reactive to light. Conjunctivae and EOM are normal. No scleral icterus.  Neck: Normal range of motion. Neck supple. No thyromegaly present.  Cardiovascular: Normal rate, regular rhythm, normal heart sounds and intact distal pulses.   No murmur heard. Pulses:      Radial pulses are 2+ on the right side, and 2+ on the left side.  Pulmonary/Chest: Effort normal and breath sounds normal. No respiratory distress. She has no wheezes. She has no rales.  Abdominal: Soft. Bowel sounds are normal. She exhibits no distension and no mass. There is no tenderness. There is no rebound and no guarding.  Musculoskeletal: Normal range of motion. She exhibits no edema.  Lymphadenopathy:    She has no cervical adenopathy.  Neurological: She is alert and oriented to person, place, and time.  CN grossly intact, station and gait intact  Skin: Skin is warm and dry. No rash noted.  Psychiatric: She has a normal mood and affect. Her behavior is normal. Judgment and thought content normal.  Nursing note and vitals reviewed.  Results for orders placed or performed in visit on 08/28/17  HM  MAMMOGRAPHY  Result Value Ref Range   HM Mammogram Self Reported Normal 0-4 Bi-Rad, Self Reported Normal      Assessment & Plan:   Problem List Items Addressed This Visit    Healthcare maintenance - Primary    Preventative protocols reviewed and updated unless pt declined. Discussed healthy diet and lifestyle.       Hypothyroidism    Update labs. Off levothyroxine for last 2 yrs.       Relevant Orders   TSH   T4, free   Mild hyperlipidemia    Update FLP      Relevant Orders   Lipid panel   Basic metabolic panel   Obesity, Class I, BMI 30.0-34.9 (see actual BMI)    Discussed healthy diet and lifestyle changes to affect sustainable weight loss.       Postmenopausal    Reviewed with patient. LMP 08/2015. No bleeding since.  fmhx early menopause.  Discussed  symptoms of menopause and management options. Suggested return to GYN to discuss possible HRT.  Discussed calcium and vit D intake and goals      Vitamin B12 deficiency   Relevant Orders   Vitamin B12   Vitamin D deficiency   Relevant Orders   VITAMIN D 25 Hydroxy (Vit-D Deficiency, Fractures)       Follow up plan: Return in about 1 year (around 08/28/2018) for annual exam, prior fasting for blood work.  Eustaquio Boyden, MD

## 2017-08-28 NOTE — Patient Instructions (Addendum)
You are doing well today. Flu shot today Labs today. Return as needed or in 1 year for next physical.  Health Maintenance for Postmenopausal Women Menopause is a normal process in which your reproductive ability comes to an end. This process happens gradually over a span of months to years, usually between the ages of 29 and 11. Menopause is complete when you have missed 12 consecutive menstrual periods. It is important to talk with your health care provider about some of the most common conditions that affect postmenopausal women, such as heart disease, cancer, and bone loss (osteoporosis). Adopting a healthy lifestyle and getting preventive care can help to promote your health and wellness. Those actions can also lower your chances of developing some of these common conditions. What should I know about menopause? During menopause, you may experience a number of symptoms, such as:  Moderate-to-severe hot flashes.  Night sweats.  Decrease in sex drive.  Mood swings.  Headaches.  Tiredness.  Irritability.  Memory problems.  Insomnia.  Choosing to treat or not to treat menopausal changes is an individual decision that you make with your health care provider. What should I know about hormone replacement therapy and supplements? Hormone therapy products are effective for treating symptoms that are associated with menopause, such as hot flashes and night sweats. Hormone replacement carries certain risks, especially as you become older. If you are thinking about using estrogen or estrogen with progestin treatments, discuss the benefits and risks with your health care provider. What should I know about heart disease and stroke? Heart disease, heart attack, and stroke become more likely as you age. This may be due, in part, to the hormonal changes that your body experiences during menopause. These can affect how your body processes dietary fats, triglycerides, and cholesterol. Heart attack  and stroke are both medical emergencies. There are many things that you can do to help prevent heart disease and stroke:  Have your blood pressure checked at least every 1-2 years. High blood pressure causes heart disease and increases the risk of stroke.  If you are 58-18 years old, ask your health care provider if you should take aspirin to prevent a heart attack or a stroke.  Do not use any tobacco products, including cigarettes, chewing tobacco, or electronic cigarettes. If you need help quitting, ask your health care provider.  It is important to eat a healthy diet and maintain a healthy weight. ? Be sure to include plenty of vegetables, fruits, low-fat dairy products, and lean protein. ? Avoid eating foods that are high in solid fats, added sugars, or salt (sodium).  Get regular exercise. This is one of the most important things that you can do for your health. ? Try to exercise for at least 150 minutes each week. The type of exercise that you do should increase your heart rate and make you sweat. This is known as moderate-intensity exercise. ? Try to do strengthening exercises at least twice each week. Do these in addition to the moderate-intensity exercise.  Know your numbers.Ask your health care provider to check your cholesterol and your blood glucose. Continue to have your blood tested as directed by your health care provider.  What should I know about cancer screening? There are several types of cancer. Take the following steps to reduce your risk and to catch any cancer development as early as possible. Breast Cancer  Practice breast self-awareness. ? This means understanding how your breasts normally appear and feel. ? It also means doing  regular breast self-exams. Let your health care provider know about any changes, no matter how small.  If you are 66 or older, have a clinician do a breast exam (clinical breast exam or CBE) every year. Depending on your age, family  history, and medical history, it may be recommended that you also have a yearly breast X-ray (mammogram).  If you have a family history of breast cancer, talk with your health care provider about genetic screening.  If you are at high risk for breast cancer, talk with your health care provider about having an MRI and a mammogram every year.  Breast cancer (BRCA) gene test is recommended for women who have family members with BRCA-related cancers. Results of the assessment will determine the need for genetic counseling and BRCA1 and for BRCA2 testing. BRCA-related cancers include these types: ? Breast. This occurs in males or females. ? Ovarian. ? Tubal. This may also be called fallopian tube cancer. ? Cancer of the abdominal or pelvic lining (peritoneal cancer). ? Prostate. ? Pancreatic.  Cervical, Uterine, and Ovarian Cancer Your health care provider may recommend that you be screened regularly for cancer of the pelvic organs. These include your ovaries, uterus, and vagina. This screening involves a pelvic exam, which includes checking for microscopic changes to the surface of your cervix (Pap test).  For women ages 21-65, health care providers may recommend a pelvic exam and a Pap test every three years. For women ages 24-65, they may recommend the Pap test and pelvic exam, combined with testing for human papilloma virus (HPV), every five years. Some types of HPV increase your risk of cervical cancer. Testing for HPV may also be done on women of any age who have unclear Pap test results.  Other health care providers may not recommend any screening for nonpregnant women who are considered low risk for pelvic cancer and have no symptoms. Ask your health care provider if a screening pelvic exam is right for you.  If you have had past treatment for cervical cancer or a condition that could lead to cancer, you need Pap tests and screening for cancer for at least 20 years after your treatment. If  Pap tests have been discontinued for you, your risk factors (such as having a new sexual partner) need to be reassessed to determine if you should start having screenings again. Some women have medical problems that increase the chance of getting cervical cancer. In these cases, your health care provider may recommend that you have screening and Pap tests more often.  If you have a family history of uterine cancer or ovarian cancer, talk with your health care provider about genetic screening.  If you have vaginal bleeding after reaching menopause, tell your health care provider.  There are currently no reliable tests available to screen for ovarian cancer.  Lung Cancer Lung cancer screening is recommended for adults 39-3 years old who are at high risk for lung cancer because of a history of smoking. A yearly low-dose CT scan of the lungs is recommended if you:  Currently smoke.  Have a history of at least 30 pack-years of smoking and you currently smoke or have quit within the past 15 years. A pack-year is smoking an average of one pack of cigarettes per day for one year.  Yearly screening should:  Continue until it has been 15 years since you quit.  Stop if you develop a health problem that would prevent you from having lung cancer treatment.  Colorectal Cancer  This type of cancer can be detected and can often be prevented.  Routine colorectal cancer screening usually begins at age 50 and continues through age 75.  If you have risk factors for colon cancer, your health care provider may recommend that you be screened at an earlier age.  If you have a family history of colorectal cancer, talk with your health care provider about genetic screening.  Your health care provider may also recommend using home test kits to check for hidden blood in your stool.  A small camera at the end of a tube can be used to examine your colon directly (sigmoidoscopy or colonoscopy). This is done to  check for the earliest forms of colorectal cancer.  Direct examination of the colon should be repeated every 5-10 years until age 75. However, if early forms of precancerous polyps or small growths are found or if you have a family history or genetic risk for colorectal cancer, you may need to be screened more often.  Skin Cancer  Check your skin from head to toe regularly.  Monitor any moles. Be sure to tell your health care provider: ? About any new moles or changes in moles, especially if there is a change in a mole's shape or color. ? If you have a mole that is larger than the size of a pencil eraser.  If any of your family members has a history of skin cancer, especially at a young age, talk with your health care provider about genetic screening.  Always use sunscreen. Apply sunscreen liberally and repeatedly throughout the day.  Whenever you are outside, protect yourself by wearing long sleeves, pants, a wide-brimmed hat, and sunglasses.  What should I know about osteoporosis? Osteoporosis is a condition in which bone destruction happens more quickly than new bone creation. After menopause, you may be at an increased risk for osteoporosis. To help prevent osteoporosis or the bone fractures that can happen because of osteoporosis, the following is recommended:  If you are 19-50 years old, get at least 1,000 mg of calcium and at least 600 mg of vitamin D per day.  If you are older than age 50 but younger than age 70, get at least 1,200 mg of calcium and at least 600 mg of vitamin D per day.  If you are older than age 70, get at least 1,200 mg of calcium and at least 800 mg of vitamin D per day.  Smoking and excessive alcohol intake increase the risk of osteoporosis. Eat foods that are rich in calcium and vitamin D, and do weight-bearing exercises several times each week as directed by your health care provider. What should I know about how menopause affects my mental  health? Depression may occur at any age, but it is more common as you become older. Common symptoms of depression include:  Low or sad mood.  Changes in sleep patterns.  Changes in appetite or eating patterns.  Feeling an overall lack of motivation or enjoyment of activities that you previously enjoyed.  Frequent crying spells.  Talk with your health care provider if you think that you are experiencing depression. What should I know about immunizations? It is important that you get and maintain your immunizations. These include:  Tetanus, diphtheria, and pertussis (Tdap) booster vaccine.  Influenza every year before the flu season begins.  Pneumonia vaccine.  Shingles vaccine.  Your health care provider may also recommend other immunizations. This information is not intended to replace advice given to you by   your health care provider. Make sure you discuss any questions you have with your health care provider. Document Released: 12/26/2005 Document Revised: 05/23/2016 Document Reviewed: 08/07/2015 Elsevier Interactive Patient Education  2018 [employer]  

## 2017-08-28 NOTE — Assessment & Plan Note (Signed)
Reviewed with patient. LMP 08/2015. No bleeding since.  fmhx early menopause.  Discussed symptoms of menopause and management options. Suggested return to GYN to discuss possible HRT.  Discussed calcium and vit D intake and goals

## 2017-08-28 NOTE — Assessment & Plan Note (Signed)
Update labs. Off levothyroxine for last 2 yrs.

## 2017-08-28 NOTE — Addendum Note (Signed)
Addended by: Nanci Pina on: 08/28/2017 11:42 AM   Modules accepted: Orders

## 2017-08-28 NOTE — Assessment & Plan Note (Signed)
Preventative protocols reviewed and updated unless pt declined. Discussed healthy diet and lifestyle.  

## 2017-08-28 NOTE — Assessment & Plan Note (Signed)
Discussed healthy diet and lifestyle changes to affect sustainable weight loss  

## 2017-08-28 NOTE — Assessment & Plan Note (Signed)
Update FLP.  

## 2017-08-31 ENCOUNTER — Other Ambulatory Visit: Payer: Self-pay | Admitting: Family Medicine

## 2017-08-31 MED ORDER — VITAMIN B-12 1000 MCG PO TABS: 1000.0000 ug | ORAL_TABLET | Freq: Every day | ORAL | Status: DC

## 2017-08-31 MED ORDER — VITAMIN D 50 MCG (2000 UT) PO CAPS: 1.0000 | ORAL_CAPSULE | Freq: Every day | ORAL | Status: DC

## 2018-01-28 LAB — HM MAMMOGRAPHY: HM Mammogram: NORMAL (ref 0–4)

## 2018-03-26 ENCOUNTER — Other Ambulatory Visit: Payer: Self-pay | Admitting: Family Medicine

## 2018-07-21 ENCOUNTER — Encounter: Payer: Self-pay | Admitting: Family Medicine

## 2018-07-22 MED ORDER — PREDNISONE 20 MG PO TABS: ORAL_TABLET | ORAL | 0 refills | Status: DC

## 2018-09-22 ENCOUNTER — Encounter: Payer: Commercial Managed Care - PPO | Admitting: Family Medicine

## 2018-09-28 ENCOUNTER — Encounter: Payer: Self-pay | Admitting: Family Medicine

## 2018-09-28 ENCOUNTER — Ambulatory Visit (INDEPENDENT_AMBULATORY_CARE_PROVIDER_SITE_OTHER): Payer: PRIVATE HEALTH INSURANCE | Admitting: Family Medicine

## 2018-09-28 VITALS — BP 134/86 | HR 76 | Temp 98.2°F | Ht 64.5 in | Wt 175.5 lb

## 2018-09-28 DIAGNOSIS — Z780 Asymptomatic menopausal state: Secondary | ICD-10-CM

## 2018-09-28 DIAGNOSIS — E039 Hypothyroidism, unspecified: Secondary | ICD-10-CM | POA: Diagnosis not present

## 2018-09-28 DIAGNOSIS — E559 Vitamin D deficiency, unspecified: Secondary | ICD-10-CM | POA: Diagnosis not present

## 2018-09-28 DIAGNOSIS — E538 Deficiency of other specified B group vitamins: Secondary | ICD-10-CM

## 2018-09-28 DIAGNOSIS — E785 Hyperlipidemia, unspecified: Secondary | ICD-10-CM | POA: Diagnosis not present

## 2018-09-28 DIAGNOSIS — R0982 Postnasal drip: Secondary | ICD-10-CM

## 2018-09-28 DIAGNOSIS — E663 Overweight: Secondary | ICD-10-CM

## 2018-09-28 DIAGNOSIS — Z0000 Encounter for general adult medical examination without abnormal findings: Secondary | ICD-10-CM | POA: Diagnosis not present

## 2018-09-28 LAB — LIPID PANEL
CHOL/HDL RATIO: 5
Cholesterol: 211 mg/dL — ABNORMAL HIGH (ref 0–200)
HDL: 45 mg/dL (ref >39.00)
LDL CALC: 138 mg/dL — AB (ref 0–99)
NONHDL: 165.63
Triglycerides: 136 mg/dL (ref 0.0–149.0)
VLDL: 27.2 mg/dL (ref 0.0–40.0)

## 2018-09-28 LAB — VITAMIN B12: Vitamin B-12: 329 pg/mL (ref 211–911)

## 2018-09-28 LAB — TSH: TSH: 2.19 u[IU]/mL (ref 0.35–4.50)

## 2018-09-28 LAB — BASIC METABOLIC PANEL
BUN: 12 mg/dL (ref 6–23)
CALCIUM: 9.2 mg/dL (ref 8.4–10.5)
CO2: 25 mEq/L (ref 19–32)
Chloride: 106 mEq/L (ref 96–112)
Creatinine, Ser: 0.72 mg/dL (ref 0.40–1.20)
GFR: 91.85 mL/min (ref >60.00)
Glucose, Bld: 100 mg/dL — ABNORMAL HIGH (ref 70–99)
Potassium: 3.6 mEq/L (ref 3.5–5.1)
Sodium: 141 mEq/L (ref 135–145)

## 2018-09-28 LAB — VITAMIN D 25 HYDROXY (VIT D DEFICIENCY, FRACTURES): VITD: 22.6 ng/mL — ABNORMAL LOW (ref 30.00–100.00)

## 2018-09-28 LAB — T4, FREE: Free T4: 0.7 ng/dL (ref 0.60–1.60)

## 2018-09-28 MED ORDER — AZELASTINE-FLUTICASONE 137-50 MCG/ACT NA SUSP: 1.0000 | Freq: Two times a day (BID) | NASAL | 3 refills | Status: DC

## 2018-09-28 NOTE — Assessment & Plan Note (Signed)
Update labs off replacement.

## 2018-09-28 NOTE — Assessment & Plan Note (Signed)
Preventative protocols reviewed and updated unless pt declined. Discussed healthy diet and lifestyle.  

## 2018-09-28 NOTE — Assessment & Plan Note (Signed)
Congratulated on weight loss to date. 

## 2018-09-28 NOTE — Progress Notes (Signed)
BP 134/86 (BP Location: Left Arm, Patient Position: Sitting, Cuff Size: Normal)   Pulse 76   Temp 98.2 F (36.8 C) (Oral)   Ht 5' 4.5" (1.638 m)   Wt 175 lb 8 oz (79.6 kg)   LMP 08/20/2015   SpO2 98%   BMI 29.66 kg/m    CC: CPE Subjective:    Patient ID: Lindsey Proctor, female    DOB: October 29, 1971, 46 y.o.   MRN: [id number]  HPI: Lindsey Proctor is a 47 y.o. female presenting on 09/28/2018 for Annual Exam   13 lb weight loss noted! Daily exercise, healthier diet choices. Exercises 20 min/day.  Moved to [place].  Ongoing sinus drainage despite singluair and flonase. Antihistamines were ineffective. Dentist said no trouble with teeth.   Preventative: Well woman exam - OBGYN Dr. Arlyce Dice retired - considering finding new local. Q2 yrs. mirena removed 07/2015. LMP - 08/2015. Estrogen started 2019. Hot flashes. Discussed HRT. She does well with calcium in diet. Not taking any cal / vit D supplement Mammogram - 01/2018 normal at GYN office.  Flu shot yearly Tdap 2012 Seat belt use discussed.  Sunscreen use discussed. No changing moles on skin Non smoker  Alcohol - none  Dentist q6 mo Eye exam yearly  Caffeine: 2 20oz caffeinated drinks/day (diet mountain dew) Lives with husband, 2 children (son and daughter), 1 dog and cat Occupation: customer service call center Activity: exercises 20 min daily Diet: fruits and vegetables daily, lots of water, red meat 4 times a week, fish rarely, decreasing carbs  Relevant past medical, surgical, family and social history reviewed and updated as indicated. Interim medical history since our last visit reviewed. Allergies and medications reviewed and updated. Outpatient Medications Prior to Visit  Medication Sig Dispense Refill  . Cholecalciferol (VITAMIN D) 2000 units CAPS Take 1 capsule (2,000 Units total) by mouth daily. 30 capsule   . estradiol (ESTRACE) 1 MG tablet Take 1 mg by mouth daily.  3  . fluticasone (FLONASE)  50 MCG/ACT nasal spray Place 2 sprays into the nose daily. 16 g 2  . montelukast (SINGULAIR) 10 MG tablet TAKE ONE TABLET BY MOUTH AT BEDTIME 30 tablet 5  . progesterone (PROMETRIUM) 100 MG capsule Take 1 capsule by mouth at bedtime.    . vitamin B-12 (CYANOCOBALAMIN) 1000 MCG tablet Take 1 tablet (1,000 mcg total) by mouth daily. (Patient not taking: Reported on 09/28/2018)    . predniSONE (DELTASONE) 20 MG tablet Take two tablets daily for 3 days followed by one tablet daily for 4 days 10 tablet 0   No facility-administered medications prior to visit.      Per HPI unless specifically indicated in ROS section below Review of Systems  Constitutional: Negative for activity change, appetite change, chills, fatigue, fever and unexpected weight change.  HENT: Positive for postnasal drip. Negative for hearing loss.   Eyes: Negative for visual disturbance.  Respiratory: Positive for cough (related to sinus drainage). Negative for chest tightness, shortness of breath and wheezing.   Cardiovascular: Negative for chest pain, palpitations and leg swelling.  Gastrointestinal: Negative for abdominal distention, abdominal pain, blood in stool, constipation, diarrhea, nausea and vomiting.  Genitourinary: Negative for difficulty urinating and hematuria.  Musculoskeletal: Negative for arthralgias, myalgias and neck pain.  Skin: Negative for rash.  Neurological: Negative for dizziness, seizures, syncope and headaches.  Hematological: Negative for adenopathy. Does not bruise/bleed easily.  Psychiatric/Behavioral: Negative for dysphoric mood. The patient is not nervous/anxious.  Objective:    BP 134/86 (BP Location: Left Arm, Patient Position: Sitting, Cuff Size: Normal)   Pulse 76   Temp 98.2 F (36.8 C) (Oral)   Ht 5' 4.5" (1.638 m)   Wt 175 lb 8 oz (79.6 kg)   LMP 08/20/2015   SpO2 98%   BMI 29.66 kg/m   Wt Readings from Last 3 Encounters:  09/28/18 175 lb 8 oz (79.6 kg)  08/28/17 188  lb (85.3 kg)  09/16/16 184 lb 12 oz (83.8 kg)    Physical Exam  Constitutional: She is oriented to person, place, and time. She appears well-developed and well-nourished. No distress.  HENT:  Head: Normocephalic and atraumatic.  Right Ear: Hearing, tympanic membrane, external ear and ear canal normal.  Left Ear: Hearing, tympanic membrane, external ear and ear canal normal.  Nose: Nose normal.  Mouth/Throat: Uvula is midline, oropharynx is clear and moist and mucous membranes are normal. No oropharyngeal exudate, posterior oropharyngeal edema or posterior oropharyngeal erythema.  Eyes: Pupils are equal, round, and reactive to light. Conjunctivae and EOM are normal. No scleral icterus.  Neck: Normal range of motion. Neck supple.  Cardiovascular: Normal rate, regular rhythm, normal heart sounds and intact distal pulses.  No murmur heard. Pulses:      Radial pulses are 2+ on the right side, and 2+ on the left side.  Pulmonary/Chest: Effort normal and breath sounds normal. No respiratory distress. She has no wheezes. She has no rales.  Abdominal: Soft. Bowel sounds are normal. She exhibits no distension and no mass. There is no tenderness. There is no rebound and no guarding.  Musculoskeletal: Normal range of motion. She exhibits no edema.  Lymphadenopathy:    She has no cervical adenopathy.  Neurological: She is alert and oriented to person, place, and time.  CN grossly intact, station and gait intact  Skin: Skin is warm and dry. No rash noted.  Psychiatric: She has a normal mood and affect. Her behavior is normal. Judgment and thought content normal.  Nursing note and vitals reviewed.  Results for orders placed or performed in visit on 09/28/18  [facility] MAMMOGRAPHY  Result Value Ref Range   HM Mammogram Self Reported Normal 0-4 Bi-Rad, Self Reported Normal      Assessment & Plan:   Problem List Items Addressed This Visit    Vitamin D deficiency    Update labs. Compliant with vit D  daily.       Relevant Orders   VITAMIN D 25 Hydroxy (Vit-D Deficiency, Fractures)   Vitamin B12 deficiency    Off oral b12 - update labs. Previously on injections.       Relevant Orders   Vitamin B12   Postmenopausal    Confirmed by OBGYN. Now on estrogen/progesterone daily. Considering establishing with more local GYN now that Dr Arlyce Dice has retired.       Post-nasal drainage    Ongoing drainage despite daily singulair and flonase - will price out dymista. Discussed possible oral antihistamine.       Overweight (BMI 25.0-29.9)    Congratulated on weight loss to date.       Mild hyperlipidemia    Chronic off meds - update labs.  The 10-year ASCVD risk score Denman George DC Jr., et al., 2013) is: 1.9%   Values used to calculate the score:     Age: 8 years     Sex: Female     Is Non-Hispanic African American: No     Diabetic: No  Tobacco smoker: No     Systolic Blood Pressure: 134 mmHg     Is BP treated: No     HDL Cholesterol: 40.2 mg/dL     Total Cholesterol: 215 mg/dL       Relevant Orders   Lipid panel   Basic metabolic panel   Hypothyroidism    Update labs off replacement.       Relevant Orders   TSH   T4, free   Healthcare maintenance - Primary    Preventative protocols reviewed and updated unless pt declined. Discussed healthy diet and lifestyle.           Meds ordered this encounter  Medications  . Azelastine-Fluticasone (DYMISTA) 137-50 MCG/ACT SUSP    Sig: Place 1 spray into the nose 2 (two) times daily.    Dispense:  23 g    Refill:  3   Orders Placed This Encounter  Procedures  . [facility] MAMMOGRAPHY    This external order was created through the Results Console.  . Lipid panel  . Basic metabolic panel  . Vitamin B12  . TSH  . T4, free  . VITAMIN D 25 Hydroxy (Vit-D Deficiency, Fractures)    Follow up plan: Return in about 1 year (around 09/29/2019) for annual exam, prior fasting for blood work.  Eustaquio Boyden, MD

## 2018-09-28 NOTE — Assessment & Plan Note (Signed)
Off oral b12 - update labs. Previously on injections.

## 2018-09-28 NOTE — Assessment & Plan Note (Addendum)
Chronic off meds - update labs.  The 10-year ASCVD risk score Denman George(Goff DC Montez HagemanJr., et al., 2013) is: 1.9%   Values used to calculate the score:     Age: 47 years     Sex: Female     Is Non-Hispanic African American: No     Diabetic: No     Tobacco smoker: No     Systolic Blood Pressure: 134 mmHg     Is BP treated: No     HDL Cholesterol: 40.2 mg/dL     Total Cholesterol: 215 mg/dL

## 2018-09-28 NOTE — Patient Instructions (Addendum)
Labs today. Try dymista in place of flonase. If unaffordable, let me know and I will send in individual components.  Return as needed or in 1 year for next physical.  Health Maintenance, Female Adopting a healthy lifestyle and getting preventive care can go a long way to promote health and wellness. Talk with your health care provider about what schedule of regular examinations is right for you. This is a good chance for you to check in with your provider about disease prevention and staying healthy. In between checkups, there are plenty of things you can do on your own. Experts have done a lot of research about which lifestyle changes and preventive measures are most likely to keep you healthy. Ask your health care provider for more information. Weight and diet Eat a healthy diet  Be sure to include plenty of vegetables, fruits, low-fat dairy products, and lean protein.  Do not eat a lot of foods high in solid fats, added sugars, or salt.  Get regular exercise. This is one of the most important things you can do for your health. ? Most adults should exercise for at least 150 minutes each week. The exercise should increase your heart rate and make you sweat (moderate-intensity exercise). ? Most adults should also do strengthening exercises at least twice a week. This is in addition to the moderate-intensity exercise.  Maintain a healthy weight  Body mass index (BMI) is a measurement that can be used to identify possible weight problems. It estimates body fat based on height and weight. Your health care provider can help determine your BMI and help you achieve or maintain a healthy weight.  For females 21 years of age and older: ? A BMI below 18.5 is considered underweight. ? A BMI of 18.5 to 24.9 is normal. ? A BMI of 25 to 29.9 is considered overweight. ? A BMI of 30 and above is considered obese.  Watch levels of cholesterol and blood lipids  You should start having your blood tested  for lipids and cholesterol at 48 years of age, then have this test every 5 years.  You may need to have your cholesterol levels checked more often if: ? Your lipid or cholesterol levels are high. ? You are older than 48 years of age. ? You are at high risk for heart disease.  Cancer screening Lung Cancer  Lung cancer screening is recommended for adults 43-41 years old who are at high risk for lung cancer because of a history of smoking.  A yearly low-dose CT scan of the lungs is recommended for people who: ? Currently smoke. ? Have quit within the past 15 years. ? Have at least a 30-pack-year history of smoking. A pack year is smoking an average of one pack of cigarettes a day for 1 year.  Yearly screening should continue until it has been 15 years since you quit.  Yearly screening should stop if you develop a health problem that would prevent you from having lung cancer treatment.  Breast Cancer  Practice breast self-awareness. This means understanding how your breasts normally appear and feel.  It also means doing regular breast self-exams. Let your health care provider know about any changes, no matter how small.  If you are in your 20s or 30s, you should have a clinical breast exam (CBE) by a health care provider every 1-3 years as part of a regular health exam.  If you are 10 or older, have a CBE every year. Also consider having  a breast X-ray (mammogram) every year.  If you have a family history of breast cancer, talk to your health care provider about genetic screening.  If you are at high risk for breast cancer, talk to your health care provider about having an MRI and a mammogram every year.  Breast cancer gene (BRCA) assessment is recommended for women who have family members with BRCA-related cancers. BRCA-related cancers include: ? Breast. ? Ovarian. ? Tubal. ? Peritoneal cancers.  Results of the assessment will determine the need for genetic counseling and BRCA1  and BRCA2 testing.  Cervical Cancer Your health care provider may recommend that you be screened regularly for cancer of the pelvic organs (ovaries, uterus, and vagina). This screening involves a pelvic examination, including checking for microscopic changes to the surface of your cervix (Pap test). You may be encouraged to have this screening done every 3 years, beginning at age 62.  For women ages 20-65, health care providers may recommend pelvic exams and Pap testing every 3 years, or they may recommend the Pap and pelvic exam, combined with testing for human papilloma virus (HPV), every 5 years. Some types of HPV increase your risk of cervical cancer. Testing for HPV may also be done on women of any age with unclear Pap test results.  Other health care providers may not recommend any screening for nonpregnant women who are considered low risk for pelvic cancer and who do not have symptoms. Ask your health care provider if a screening pelvic exam is right for you.  If you have had past treatment for cervical cancer or a condition that could lead to cancer, you need Pap tests and screening for cancer for at least 20 years after your treatment. If Pap tests have been discontinued, your risk factors (such as having a new sexual partner) need to be reassessed to determine if screening should resume. Some women have medical problems that increase the chance of getting cervical cancer. In these cases, your health care provider may recommend more frequent screening and Pap tests.  Colorectal Cancer  This type of cancer can be detected and often prevented.  Routine colorectal cancer screening usually begins at 48 years of age and continues through 48 years of age.  Your health care provider may recommend screening at an earlier age if you have risk factors for colon cancer.  Your health care provider may also recommend using home test kits to check for hidden blood in the stool.  A small camera at  the end of a tube can be used to examine your colon directly (sigmoidoscopy or colonoscopy). This is done to check for the earliest forms of colorectal cancer.  Routine screening usually begins at age 66.  Direct examination of the colon should be repeated every 5-10 years through 48 years of age. However, you may need to be screened more often if early forms of precancerous polyps or small growths are found.  Skin Cancer  Check your skin from head to toe regularly.  Tell your health care provider about any new moles or changes in moles, especially if there is a change in a mole's shape or color.  Also tell your health care provider if you have a mole that is larger than the size of a pencil eraser.  Always use sunscreen. Apply sunscreen liberally and repeatedly throughout the day.  Protect yourself by wearing long sleeves, pants, a wide-brimmed hat, and sunglasses whenever you are outside.  Heart disease, diabetes, and high blood pressure  High blood pressure causes heart disease and increases the risk of stroke. High blood pressure is more likely to develop in: ? People who have blood pressure in the high end of the normal range (130-139/85-89 mm Hg). ? People who are overweight or obese. ? People who are African American.  If you are 32-81 years of age, have your blood pressure checked every 3-5 years. If you are 37 years of age or older, have your blood pressure checked every year. You should have your blood pressure measured twice-once when you are at a hospital or clinic, and once when you are not at a hospital or clinic. Record the average of the two measurements. To check your blood pressure when you are not at a hospital or clinic, you can use: ? An automated blood pressure machine at a pharmacy. ? A home blood pressure monitor.  If you are between 27 years and 26 years old, ask your health care provider if you should take aspirin to prevent strokes.  Have regular diabetes  screenings. This involves taking a blood sample to check your fasting blood sugar level. ? If you are at a normal weight and have a low risk for diabetes, have this test once every three years after 48 years of age. ? If you are overweight and have a high risk for diabetes, consider being tested at a younger age or more often. Preventing infection Hepatitis B  If you have a higher risk for hepatitis B, you should be screened for this virus. You are considered at high risk for hepatitis B if: ? You were born in a country where hepatitis B is common. Ask your health care provider which countries are considered high risk. ? Your parents were born in a high-risk country, and you have not been immunized against hepatitis B (hepatitis B vaccine). ? You have HIV or AIDS. ? You use needles to inject street drugs. ? You live with someone who has hepatitis B. ? You have had sex with someone who has hepatitis B. ? You get hemodialysis treatment. ? You take certain medicines for conditions, including cancer, organ transplantation, and autoimmune conditions.  Hepatitis C  Blood testing is recommended for: ? Everyone born from [birth year] through 1966. ? Anyone with known risk factors for hepatitis C.  Sexually transmitted infections (STIs)  You should be screened for sexually transmitted infections (STIs) including gonorrhea and chlamydia if: ? You are sexually active and are younger than 48 years of age. ? You are older than 48 years of age and your health care provider tells you that you are at risk for this type of infection. ? Your sexual activity has changed since you were last screened and you are at an increased risk for chlamydia or gonorrhea. Ask your health care provider if you are at risk.  If you do not have HIV, but are at risk, it may be recommended that you take a prescription medicine daily to prevent HIV infection. This is called pre-exposure prophylaxis (PrEP). You are considered at risk  if: ? You are sexually active and do not regularly use condoms or know the HIV status of your partner(s). ? You take drugs by injection. ? You are sexually active with a partner who has HIV.  Talk with your health care provider about whether you are at high risk of being infected with HIV. If you choose to begin PrEP, you should first be tested for HIV. You should then be tested every 3 months  for as long as you are taking PrEP. Pregnancy  If you are premenopausal and you may become pregnant, ask your health care provider about preconception counseling.  If you may become pregnant, take 400 to 800 micrograms (mcg) of folic acid every day.  If you want to prevent pregnancy, talk to your health care provider about birth control (contraception). Osteoporosis and menopause  Osteoporosis is a disease in which the bones lose minerals and strength with aging. This can result in serious bone fractures. Your risk for osteoporosis can be identified using a bone density scan.  If you are 65 years of age or older, or if you are at risk for osteoporosis and fractures, ask your health care provider if you should be screened.  Ask your health care provider whether you should take a calcium or vitamin D supplement to lower your risk for osteoporosis.  Menopause may have certain physical symptoms and risks.  Hormone replacement therapy may reduce some of these symptoms and risks. Talk to your health care provider about whether hormone replacement therapy is right for you. Follow these instructions at home:  Schedule regular health, dental, and eye exams.  Stay current with your immunizations.  Do not use any tobacco products including cigarettes, chewing tobacco, or electronic cigarettes.  If you are pregnant, do not drink alcohol.  If you are breastfeeding, limit how much and how often you drink alcohol.  Limit alcohol intake to no more than 1 drink per day for nonpregnant women. One drink  equals 12 ounces of beer, 5 ounces of wine, or 1 ounces of hard liquor.  Do not use street drugs.  Do not share needles.  Ask your health care provider for help if you need support or information about quitting drugs.  Tell your health care provider if you often feel depressed.  Tell your health care provider if you have ever been abused or do not feel safe at home. This information is not intended to replace advice given to you by your health care provider. Make sure you discuss any questions you have with your health care provider. Document Released: 05/19/2011 Document Revised: 04/10/2016 Document Reviewed: 08/07/2015 Elsevier Interactive Patient Education  2018 [employer]  

## 2018-09-28 NOTE — Assessment & Plan Note (Signed)
Ongoing drainage despite daily singulair and flonase - will price out dymista. Discussed possible oral antihistamine.

## 2018-09-28 NOTE — Assessment & Plan Note (Signed)
Confirmed by OBGYN. Now on estrogen/progesterone daily. Considering establishing with more local GYN now that Dr Arlyce DiceKaplan has retired.

## 2018-09-28 NOTE — Assessment & Plan Note (Signed)
Update labs. Compliant with vit D daily.

## 2018-09-29 ENCOUNTER — Other Ambulatory Visit: Payer: Self-pay | Admitting: Family Medicine

## 2018-09-29 MED ORDER — VITAMIN D3 1.25 MG (50000 UT) PO TABS: 1.0000 | ORAL_TABLET | ORAL | 1 refills | Status: DC

## 2018-10-05 ENCOUNTER — Encounter: Payer: Self-pay | Admitting: Family Medicine

## 2018-10-11 MED ORDER — AMOXICILLIN-POT CLAVULANATE 875-125 MG PO TABS: 1.0000 | ORAL_TABLET | Freq: Two times a day (BID) | ORAL | 0 refills | Status: AC

## 2018-11-02 ENCOUNTER — Encounter: Payer: Self-pay | Admitting: Family Medicine

## 2018-11-02 ENCOUNTER — Ambulatory Visit (INDEPENDENT_AMBULATORY_CARE_PROVIDER_SITE_OTHER): Payer: PRIVATE HEALTH INSURANCE | Admitting: Family Medicine

## 2018-11-02 VITALS — BP 110/80 | HR 77 | Temp 98.3°F | Ht 64.5 in | Wt 179.8 lb

## 2018-11-02 DIAGNOSIS — M7042 Prepatellar bursitis, left knee: Secondary | ICD-10-CM | POA: Insufficient documentation

## 2018-11-02 MED ORDER — VITAMIN D3 1.25 MG (50000 UT) PO TABS: 1.0000 | ORAL_TABLET | ORAL | 1 refills | Status: DC

## 2018-11-02 MED ORDER — DICLOFENAC SODIUM 75 MG PO TBEC: 75.0000 mg | DELAYED_RELEASE_TABLET | Freq: Two times a day (BID) | ORAL | 0 refills | Status: DC

## 2018-11-02 NOTE — Patient Instructions (Signed)
Stop naproxsyn.  Start diclofenac 75 mg twice daily.  Ice, rest.  Call if  not improving in 2 weeks, sooner if significant increase in pan and new redness.   Prepatellar Bursitis Prepatellar bursitis is inflammation of the prepatellar bursa. The prepatellar bursa is a fluid-filled sac that cushions the kneecap (patella). Prepatellar bursitis happens when fluid builds up in the this sac and causes it to get larger. The condition causes knee pain. What are the causes? This condition may be caused by:  Constant pressure on the knees from kneeling.  A hit to the knee.  Falling on the knee.  A bacterial infection.  Moving the knee often in a forceful way.  What increases the risk? This condition is more likely to develop in:  People who play a sport that involves a risk for falls on the knee or hard hits (blows) to the knee. These sports include: ? Football. ? Wrestling. ? Basketball. ? Soccer.  People who have to kneel for long periods of time, such as roofers, plumbers, and gardeners.  People with another inflammatory condition, such as gout or rheumatoid arthritis.  What are the signs or symptoms? The most common symptom of this condition is knee pain that gets better with rest. Other symptoms include:  Swelling on the front of the kneecap.  Warmth in the knee.  Tenderness with activity.  Redness in the knee.  Inability to bend the knee or to kneel.  How is this diagnosed? This condition may be diagnosed based on:  Your symptoms.  Your medical history.  A physical exam. During the exam, your provider will compare your knees and check for tenderness and pain when moving your knee. Your health care provider may also use a needle to remove fluid from the bursa to help diagnose an infection.  Tests, such as: ? A blood test that checks for infection. ? X-rays. These may be taken to check the structure of the patella. ? MRI or ultrasound. These may be done to check  for swelling and fluid buildup in the bursa.  How is this treated? This condition may be treated by:  Resting the knee.  Applying ice to the knee.  Medicine, such as: ? Nonsteroidal anti-inflammatory drugs (NSAIDs). These can help to reduce pain and swelling. ? Antibiotic medicines. These may be needed if you have an infection. ? Steroid medicines. These may be prescribed if other treatments are not helping.  Doing strengthening and stretching exercises (physical therapy). These may be recommended after pain and swelling improve.  Having a procedure to remove fluid from the bursa. This may be done if other treatment is not helping.  Having surgery to remove the bursa. This may be done if you have a severe infection or if the condition keeps coming back after treatment.  Follow these instructions at home: Medicines  Take over-the-counter and prescription medicines only as told by your health care provider.  If you were prescribed an antibiotic medicine, take it as told by your health care provider. Do not stop taking the antibiotic even if you start to feel better. Managing pain, stiffness, and swelling  If directed, apply ice to your knee. ? Put ice in a plastic bag. ? Place a towel between your skin and the bag. ? Leave the ice on for 20 minutes, 2-3 times a day.  Elevate your knee above the level of your heart while you are sitting or lying down. Driving  Do not drive or operate heavy machinery  while taking prescription pain medicine.  Ask your health care provider when it is safe fpr you to drive. Activity  Rest your knee.  Avoid activities that cause pain.  Return to your normal activities as told by your health care provider. Ask your health care provider what activities are safe for you.  Do exercises as told by your health care provider. General instructions  Do not use the injured limb to support your body weight until your health care provider says that you  can.  Do not use any tobacco products, such as cigarettes, chewing tobacco, and e-cigarettes. Tobacco can delay bone healing. If you need help quitting, ask your health care provider.  Keep all follow-up visits as told by your health care provider. This is important. How is this prevented?  Warm up and stretch before being active.  Cool down and stretch after being active.  Give your body time to rest between periods of activity.  Make sure to use equipment that fits you.  Be safe and responsible while being active to avoid falls.  Do at least 150 minutes of moderate-intensity exercise each week, such as brisk walking or water aerobics.  Maintain physical fitness, including: ? Strength. ? Flexibility. ? Cardiovascular fitness. ? Endurance. Contact a health care provider if:  Your symptoms do not improve.  Your symptoms get worse.  Your symptoms keep coming back after treatment.  You develop a fever and have warmth, redness, and swelling over your knee. This information is not intended to replace advice given to you by your health care provider. Make sure you discuss any questions you have with your health care provider. Document Released: 11/03/2005 Document Revised: 07/08/2016 Document Reviewed: 08/03/2015 Elsevier Interactive Patient Education  [employer]2018 [employer]

## 2018-11-02 NOTE — Assessment & Plan Note (Signed)
No clear sign or infeciton or gout. Most likely related to fall over summer and repetitive kneeling.  rest, ICe, NSAIDs and  If not improving return to see Dr. Reece AgarG or for ORTHO/ SM referral for aspiration, steroids etc.

## 2018-11-02 NOTE — Progress Notes (Signed)
Subjective:    Patient ID: Lindsey Proctor, female    DOB: 09/04/1971, 47 y.o.   MRN: [id number]  Knee Pain   The incident occurred 5 to 7 days ago. The incident occurred at home. The injury mechanism was a direct blow ( over summer fell on knee.). The pain is present in the left knee. The pain is moderate. The pain has been intermittent since onset. Associated symptoms comments: Pain with direct pressure on knee like kneeling.. The symptoms are aggravated by weight bearing. She has tried NSAIDs, ice, heat and rest (naproxsyn 500 mg daily) for the symptoms. The treatment provided moderate ( Was getting better until woke iup thisi morning she had a large amount of swelling anteriorly.) relief.   HX:   She does exercise few times a week with treadmill and gets down on knee for exercises. Blood pressure 110/80, pulse 77, temperature 98.3 F (36.8 C), temperature source Oral, height 5' 4.5" (1.638 m), weight 179 lb 12 oz (81.5 kg).   Review of Systems  Constitutional: Negative for fatigue and fever.  HENT: Negative for congestion.   Eyes: Negative for pain.  Respiratory: Negative for cough and shortness of breath.   Cardiovascular: Negative for chest pain, palpitations and leg swelling.  Gastrointestinal: Negative for abdominal pain.  Genitourinary: Negative for dysuria and vaginal bleeding.  Musculoskeletal: Negative for back pain.  Neurological: Negative for syncope, light-headedness and headaches.  Psychiatric/Behavioral: Negative for dysphoric mood.       Objective:   Physical Exam Constitutional:      General: She is not in acute distress.    Appearance: Normal appearance. She is well-developed. She is not ill-appearing or toxic-appearing.  HENT:     Head: Normocephalic.     Right Ear: Hearing, tympanic membrane, ear canal and external ear normal. Tympanic membrane is not erythematous, retracted or bulging.     Left Ear: Hearing, tympanic membrane, ear canal and external ear  normal. Tympanic membrane is not erythematous, retracted or bulging.     Nose: No mucosal edema or rhinorrhea.     Right Sinus: No maxillary sinus tenderness or frontal sinus tenderness.     Left Sinus: No maxillary sinus tenderness or frontal sinus tenderness.     Mouth/Throat:     Pharynx: Uvula midline.  Eyes:     General: Lids are normal. Lids are everted, no foreign bodies appreciated.     Conjunctiva/sclera: Conjunctivae normal.     Pupils: Pupils are equal, round, and reactive to light.  Neck:     Musculoskeletal: Normal range of motion and neck supple.     Thyroid: No thyroid mass or thyromegaly.     Vascular: No carotid bruit.     Trachea: Trachea normal.  Cardiovascular:     Rate and Rhythm: Normal rate and regular rhythm.     Pulses: Normal pulses.     Heart sounds: Normal heart sounds, S1 normal and S2 normal. No murmur. No friction rub. No gallop.   Pulmonary:     Effort: Pulmonary effort is normal. No tachypnea or respiratory distress.     Breath sounds: Normal breath sounds. No decreased breath sounds, wheezing, rhonchi or rales.  Abdominal:     General: Bowel sounds are normal.     Palpations: Abdomen is soft.     Tenderness: There is no abdominal tenderness.  Musculoskeletal:     Right shoulder: She exhibits tenderness and swelling. She exhibits normal range of motion, no bony tenderness, no deformity, no  laceration and no pain.     Comments:  Large swollen minimally tender prepatellar  Bursa.. no redness or warmth.  Skin:    General: Skin is warm and dry.     Findings: No rash.  Neurological:     Mental Status: She is alert.  Psychiatric:        Mood and Affect: Mood is not anxious or depressed.        Speech: Speech normal.        Behavior: Behavior normal. Behavior is cooperative.        Thought Content: Thought content normal.        Judgment: Judgment normal.           Assessment & Plan:

## 2018-11-04 NOTE — Telephone Encounter (Signed)
Saw dr Ermalene Searingbedsole.

## 2019-07-13 ENCOUNTER — Telehealth: Payer: PRIVATE HEALTH INSURANCE | Admitting: Nurse Practitioner

## 2019-07-13 DIAGNOSIS — H1031 Unspecified acute conjunctivitis, right eye: Secondary | ICD-10-CM

## 2019-07-13 MED ORDER — POLYMYXIN B-TRIMETHOPRIM 10000-0.1 UNIT/ML-% OP SOLN: 2.0000 [drp] | OPHTHALMIC | 0 refills | Status: DC

## 2019-07-13 NOTE — Progress Notes (Signed)
We are sorry that you are not feeling well.  Here is how we plan to help!  Based on what you have shared with me it looks like you have conjunctivitis.  Conjunctivitis is a common inflammatory or infectious condition of the eye that is often referred to as "pink eye".  In most cases it is contagious (viral or bacterial). However, not all conjunctivitis requires antibiotics (ex. Allergic).  We have made appropriate suggestions for you based upon your presentation.  I have prescribed Polytrim Ophthalmic drops 1-2 drops 4 times a day times 5 days  Pink eye can be highly contagious.  It is typically spread through direct contact with secretions, or contaminated objects or surfaces that one may have touched.  Strict handwashing is suggested with soap and water is urged.  If not available, use alcohol based had sanitizer.  Avoid unnecessary touching of the eye.  If you wear contact lenses, you will need to refrain from wearing them until you see no white discharge from the eye for at least 24 hours after being on medication.  You should see symptom improvement in 1-2 days after starting the medication regimen.  Call us if symptoms are not improved in 1-2 days.  Home Care:  Wash your hands often!  Do not wear your contacts until you complete your treatment plan.  Avoid sharing towels, bed linen, personal items with a person who has pink eye.  See attention for anyone in your home with similar symptoms.  Get Help Right Away If:  Your symptoms do not improve.  You develop blurred or loss of vision.  Your symptoms worsen (increased discharge, pain or redness)  Your e-visit answers were reviewed by a board certified advanced clinical practitioner to complete your personal care plan.  Depending on the condition, your plan could have included both over the counter or prescription medications.  If there is a problem please reply  once you have received a response from your provider.  Your safety is  important to us.  If you have drug allergies check your prescription carefully.    You can use MyChart to ask questions about today's visit, request a non-urgent call back, or ask for a work or school excuse for 24 hours related to this e-Visit. If it has been greater than 24 hours you will need to follow up with your provider, or enter a new e-Visit to address those concerns.   You will get an e-mail in the next two days asking about your experience.  I hope that your e-visit has been valuable and will speed your recovery. Thank you for using e-visits.   5-10 minutes spent reviewing and documenting in chart.   

## 2019-09-18 DIAGNOSIS — U071 COVID-19: Secondary | ICD-10-CM

## 2019-09-18 HISTORY — DX: COVID-19: U07.1

## 2019-10-21 ENCOUNTER — Encounter: Payer: Self-pay | Admitting: Family Medicine

## 2019-10-24 ENCOUNTER — Encounter: Payer: Self-pay | Admitting: Family Medicine

## 2019-12-05 ENCOUNTER — Encounter: Payer: PRIVATE HEALTH INSURANCE | Admitting: Family Medicine

## 2019-12-14 ENCOUNTER — Other Ambulatory Visit: Payer: Self-pay

## 2019-12-14 ENCOUNTER — Encounter: Payer: Self-pay | Admitting: Family Medicine

## 2019-12-14 ENCOUNTER — Ambulatory Visit (INDEPENDENT_AMBULATORY_CARE_PROVIDER_SITE_OTHER): Payer: Commercial Managed Care - PPO | Admitting: Family Medicine

## 2019-12-14 ENCOUNTER — Ambulatory Visit (INDEPENDENT_AMBULATORY_CARE_PROVIDER_SITE_OTHER)
Admission: RE | Admit: 2019-12-14 | Discharge: 2019-12-14 | Disposition: A | Discharge status: Home / Self Care | Payer: Commercial Managed Care - PPO | Source: Ambulatory Visit | Attending: Family Medicine | Admitting: Family Medicine

## 2019-12-14 VITALS — BP 120/72 | HR 75 | Temp 97.5°F | Ht 64.5 in | Wt 181.6 lb

## 2019-12-14 DIAGNOSIS — E559 Vitamin D deficiency, unspecified: Secondary | ICD-10-CM

## 2019-12-14 DIAGNOSIS — M25552 Pain in left hip: Secondary | ICD-10-CM

## 2019-12-14 DIAGNOSIS — Z0000 Encounter for general adult medical examination without abnormal findings: Secondary | ICD-10-CM

## 2019-12-14 DIAGNOSIS — E785 Hyperlipidemia, unspecified: Secondary | ICD-10-CM | POA: Diagnosis not present

## 2019-12-14 DIAGNOSIS — E039 Hypothyroidism, unspecified: Secondary | ICD-10-CM

## 2019-12-14 DIAGNOSIS — Z0001 Encounter for general adult medical examination with abnormal findings: Secondary | ICD-10-CM

## 2019-12-14 DIAGNOSIS — E538 Deficiency of other specified B group vitamins: Secondary | ICD-10-CM | POA: Diagnosis not present

## 2019-12-14 DIAGNOSIS — Z780 Asymptomatic menopausal state: Secondary | ICD-10-CM

## 2019-12-14 DIAGNOSIS — R0982 Postnasal drip: Secondary | ICD-10-CM

## 2019-12-14 DIAGNOSIS — M1612 Unilateral primary osteoarthritis, left hip: Secondary | ICD-10-CM | POA: Insufficient documentation

## 2019-12-14 LAB — BASIC METABOLIC PANEL
BUN: 21 mg/dL (ref 6–23)
CO2: 28 mEq/L (ref 19–32)
Calcium: 9.5 mg/dL (ref 8.4–10.5)
Chloride: 105 mEq/L (ref 96–112)
Creatinine, Ser: 0.7 mg/dL (ref 0.40–1.20)
GFR: 88.82 mL/min (ref >60.00)
Glucose, Bld: 92 mg/dL (ref 70–99)
Potassium: 3.8 mEq/L (ref 3.5–5.1)
Sodium: 139 mEq/L (ref 135–145)

## 2019-12-14 LAB — LIPID PANEL
Cholesterol: 210 mg/dL — ABNORMAL HIGH (ref 0–200)
HDL: 45.5 mg/dL (ref >39.00)
LDL Cholesterol: 146 mg/dL — ABNORMAL HIGH (ref 0–99)
NonHDL: 164.36
Total CHOL/HDL Ratio: 5
Triglycerides: 92 mg/dL (ref 0.0–149.0)
VLDL: 18.4 mg/dL (ref 0.0–40.0)

## 2019-12-14 LAB — VITAMIN D 25 HYDROXY (VIT D DEFICIENCY, FRACTURES): VITD: 40.35 ng/mL (ref 30.00–100.00)

## 2019-12-14 LAB — VITAMIN B12: Vitamin B-12: 263 pg/mL (ref 211–911)

## 2019-12-14 LAB — TSH: TSH: 2.19 u[IU]/mL (ref 0.35–4.50)

## 2019-12-14 LAB — T4, FREE: Free T4: 0.71 ng/dL (ref 0.60–1.60)

## 2019-12-14 MED ORDER — VITAMIN D3 1.25 MG (50000 UT) PO TABS: 1.0000 | ORAL_TABLET | ORAL | 3 refills | Status: DC

## 2019-12-14 NOTE — Assessment & Plan Note (Signed)
Managing with PRN singulair.

## 2019-12-14 NOTE — Assessment & Plan Note (Signed)
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

## 2019-12-14 NOTE — Assessment & Plan Note (Addendum)
Off replacement - update level.

## 2019-12-14 NOTE — Assessment & Plan Note (Signed)
Continues weekly replacement - update vit D level.

## 2019-12-14 NOTE — Patient Instructions (Addendum)
Labs today Call insurance to see if they cover colonoscopy this year - if so let me know and I will place referral.  Sounds like you may have sciatica, hip bursitis, and hip arthritis. Try OTC topical voltaren gel to lateral hip (?bursitis). Let me know if no better for referral to sports medicine to consider steroid shot into bursa.  Hip xray today to evaluate for arthritis.  Good to see you today, call us with questions.   Health Maintenance for Postmenopausal Women Menopause is a normal process in which your ability to get pregnant comes to an end. This process happens slowly over many months or years, usually between the ages of 72 and 45. Menopause is complete when you have missed your menstrual periods for 12 months. It is important to talk with your health care provider about some of the most common conditions that affect women after menopause (postmenopausal women). These include heart disease, cancer, and bone loss (osteoporosis). Adopting a healthy lifestyle and getting preventive care can help to promote your health and wellness. The actions you take can also lower your chances of developing some of these common conditions. What should I know about menopause? During menopause, you may get a number of symptoms, such as:  Hot flashes. These can be moderate or severe.  Night sweats.  Decrease in sex drive.  Mood swings.  Headaches.  Tiredness.  Irritability.  Memory problems.  Insomnia. Choosing to treat or not to treat these symptoms is a decision that you make with your health care provider. Do I need hormone replacement therapy?  Hormone replacement therapy is effective in treating symptoms that are caused by menopause, such as hot flashes and night sweats.  Hormone replacement carries certain risks, especially as you become older. If you are thinking about using estrogen or estrogen with progestin, discuss the benefits and risks with your health care provider. What is  my risk for heart disease and stroke? The risk of heart disease, heart attack, and stroke increases as you age. One of the causes may be a change in the body's hormones during menopause. This can affect how your body uses dietary fats, triglycerides, and cholesterol. Heart attack and stroke are medical emergencies. There are many things that you can do to help prevent heart disease and stroke. Watch your blood pressure  High blood pressure causes heart disease and increases the risk of stroke. This is more likely to develop in people who have high blood pressure readings, are of African descent, or are overweight.  Have your blood pressure checked: ? Every 3-5 years if you are 50-33 years of age. ? Every year if you are 50 years old or older. Eat a healthy diet   Eat a diet that includes plenty of vegetables, fruits, low-fat dairy products, and lean protein.  Do not eat a lot of foods that are high in solid fats, added sugars, or sodium. Get regular exercise Get regular exercise. This is one of the most important things you can do for your health. Most adults should:  Try to exercise for at least 150 minutes each week. The exercise should increase your heart rate and make you sweat (moderate-intensity exercise).  Try to do strengthening exercises at least twice each week. Do these in addition to the moderate-intensity exercise.  Spend less time sitting. Even light physical activity can be beneficial. Other tips  Work with your health care provider to achieve or maintain a healthy weight.  Do not use any products  that contain nicotine or tobacco, such as cigarettes, e-cigarettes, and chewing tobacco. If you need help quitting, ask your health care provider.  Know your numbers. Ask your health care provider to check your cholesterol and your blood sugar (glucose). Continue to have your blood tested as directed by your health care provider. Do I need screening for cancer? Depending on  your health history and family history, you may need to have cancer screening at different stages of your life. This may include screening for:  Breast cancer.  Cervical cancer.  Lung cancer.  Colorectal cancer. What is my risk for osteoporosis? After menopause, you may be at increased risk for osteoporosis. Osteoporosis is a condition in which bone destruction happens more quickly than new bone creation. To help prevent osteoporosis or the bone fractures that can happen because of osteoporosis, you may take the following actions:  If you are 13-83 years old, get at least 1,000 mg of calcium and at least 600 mg of vitamin D per day.  If you are older than age 75 but younger than age 67, get at least 1,200 mg of calcium and at least 600 mg of vitamin D per day.  If you are older than age 61, get at least 1,200 mg of calcium and at least 800 mg of vitamin D per day. Smoking and drinking excessive alcohol increase the risk of osteoporosis. Eat foods that are rich in calcium and vitamin D, and do weight-bearing exercises several times each week as directed by your health care provider. How does menopause affect my mental health? Depression may occur at any age, but it is more common as you become older. Common symptoms of depression include:  Low or sad mood.  Changes in sleep patterns.  Changes in appetite or eating patterns.  Feeling an overall lack of motivation or enjoyment of activities that you previously enjoyed.  Frequent crying spells. Talk with your health care provider if you think that you are experiencing depression. General instructions See your health care provider for regular wellness exams and vaccines. This may include:  Scheduling regular health, dental, and eye exams.  Getting and maintaining your vaccines. These include: ? Influenza vaccine. Get this vaccine each year before the flu season begins. ? Pneumonia vaccine. ? Shingles vaccine. ? Tetanus, diphtheria,  and pertussis (Tdap) booster vaccine. Your health care provider may also recommend other immunizations. Tell your health care provider if you have ever been abused or do not feel safe at home. Summary  Menopause is a normal process in which your ability to get pregnant comes to an end.  This condition causes hot flashes, night sweats, decreased interest in sex, mood swings, headaches, or lack of sleep.  Treatment for this condition may include hormone replacement therapy.  Take actions to keep yourself healthy, including exercising regularly, eating a healthy diet, watching your weight, and checking your blood pressure and blood sugar levels.  Get screened for cancer and depression. Make sure that you are up to date with all your vaccines. This information is not intended to replace advice given to you by your health care provider. Make sure you discuss any questions you have with your health care provider. Document Revised: 10/27/2018 Document Reviewed: 10/27/2018 Elsevier Patient Education  2020 [employer].

## 2019-12-14 NOTE — Progress Notes (Addendum)
This visit was conducted in person.  BP 120/72 (BP Location: Left Arm, Patient Position: Sitting, Cuff Size: Large)   Pulse 75   Temp (!) 97.5 F (36.4 C) (Temporal)   Ht 5' 4.5" (1.638 m)   Wt 181 lb 9 oz (82.4 kg)   SpO2 98%   BMI 30.68 kg/m    CC: CPE Subjective:    Patient ID: Lindsey Proctor, female    DOB: 05-Mar-1971, 49 y.o.   MRN: [id number]  HPI: Lindsey Proctor is a 49 y.o. female presenting on 12/14/2019 for Annual Exam   Covid-19 infection 09/2019 - symptoms have largely resolved. She still notes intermittent chest discomfort about twice a week described as sharp central chest pain that lasts a few second, no identified trigger.   Ongoing L leg pain - h/o injury to left leg x2, initial bruise to lateral thigh while sledding ~2015. Second injury occurred 2019 when she fell onto knee during softball game, treated for prepatellar bursitis.   Now with L hip pain over the past year - tender at groin, sore lateral hip, shooting pain at buttock (worse pain at buttock, but now unable to do sciatic exercises due to groin pain). Unable to cross left hip. More trouble walking on incline. Hip pain is activity limiting. Saw chiropractor - told hips were out of line, s/p alignment with limited benefit.   Preventative: Colon cancer screening - discussed, would like colonoscopy ([place] preference)  Well woman - [facility] upcoming appt 12/2019 with Dr Chestine Spore, Q2 yrs. LMP -08/2015. Estrogen 2019-2020 - no benefit so she stopped. Hot flashes have resolved. She does well with calcium in diet. Not taking any cal / vit D supplement.  Mammogram - due - gets at GYN office.  Flu shotyearly Tdap 2012 Seat belt use discussed. Sunscreen use discussed. No changing moles on skin Non smoker - mother smokes outside Alcohol - none  Dentist q6 mo Eye exam yearly  Caffeine: 2 20oz caffeinated drinks/day (diet mountain dew) Lives with husband, 2 children (son and daughter), 1 dog and  cat Occupation: customer service call center Activity: exercises 20 min daily Diet: fruits and vegetables daily, lots of water, red meat 4 times a week, fish rarely, decreasing carbs     Relevant past medical, surgical, family and social history reviewed and updated as indicated. Interim medical history since our last visit reviewed. Allergies and medications reviewed and updated. Outpatient Medications Prior to Visit  Medication Sig Dispense Refill  . montelukast (SINGULAIR) 10 MG tablet TAKE ONE TABLET BY MOUTH AT BEDTIME (Patient taking differently: As needed) 30 tablet 5  . Cholecalciferol (VITAMIN D3) 1.25 MG (50000 UT) TABS Take 1 tablet by mouth once a week. 12 tablet 1  . Azelastine-Fluticasone (DYMISTA) 137-50 MCG/ACT SUSP Place 1 spray into the nose 2 (two) times daily. 23 g 3  . diclofenac (VOLTAREN) 75 MG EC tablet Take 1 tablet (75 mg total) by mouth 2 (two) times daily. 30 tablet 0  . estradiol (ESTRACE) 1 MG tablet Take 1 mg by mouth daily.  3  . progesterone (PROMETRIUM) 100 MG capsule Take 1 capsule by mouth at bedtime.    Marland Kitchen trimethoprim-polymyxin b (POLYTRIM) ophthalmic solution Place 2 drops into both eyes every 4 (four) hours. 10 mL 0   No facility-administered medications prior to visit.     Per HPI unless specifically indicated in ROS section below Review of Systems  Constitutional: Negative for activity change, appetite change, chills, fatigue, fever and  unexpected weight change.  HENT: Negative for hearing loss.   Eyes: Negative for visual disturbance.  Respiratory: Negative for cough, chest tightness, shortness of breath and wheezing.   Cardiovascular: Positive for chest pain (see HPI). Negative for palpitations and leg swelling.  Gastrointestinal: Negative for abdominal distention, abdominal pain, blood in stool, constipation, diarrhea, nausea and vomiting.  Genitourinary: Negative for difficulty urinating and hematuria.  Musculoskeletal: Negative for  arthralgias, myalgias and neck pain.  Skin: Negative for rash.  Neurological: Negative for dizziness, seizures, syncope and headaches.  Hematological: Negative for adenopathy. Does not bruise/bleed easily.  Psychiatric/Behavioral: Negative for dysphoric mood. The patient is not nervous/anxious.    Objective:    BP 120/72 (BP Location: Left Arm, Patient Position: Sitting, Cuff Size: Large)   Pulse 75   Temp (!) 97.5 F (36.4 C) (Temporal)   Ht 5' 4.5" (1.638 m)   Wt 181 lb 9 oz (82.4 kg)   SpO2 98%   BMI 30.68 kg/m   Wt Readings from Last 3 Encounters:  12/14/19 181 lb 9 oz (82.4 kg)  11/02/18 179 lb 12 oz (81.5 kg)  09/28/18 175 lb 8 oz (79.6 kg)    Physical Exam Vitals and nursing note reviewed.  Constitutional:      General: She is not in acute distress.    Appearance: Normal appearance. She is well-developed. She is not ill-appearing.  HENT:     Head: Normocephalic and atraumatic.     Right Ear: Hearing, tympanic membrane, ear canal and external ear normal.     Left Ear: Hearing, tympanic membrane, ear canal and external ear normal.     Mouth/Throat:     Pharynx: Uvula midline.  Eyes:     General: No scleral icterus.    Extraocular Movements: Extraocular movements intact.     Conjunctiva/sclera: Conjunctivae normal.     Pupils: Pupils are equal, round, and reactive to light.  Cardiovascular:     Rate and Rhythm: Normal rate and regular rhythm.     Pulses: Normal pulses.          Radial pulses are 2+ on the right side and 2+ on the left side.     Heart sounds: Normal heart sounds. No murmur.  Pulmonary:     Effort: Pulmonary effort is normal. No respiratory distress.     Breath sounds: Normal breath sounds. No wheezing, rhonchi or rales.  Abdominal:     General: Abdomen is flat. Bowel sounds are normal. There is no distension.     Palpations: Abdomen is soft. There is no mass.     Tenderness: There is no abdominal tenderness. There is no guarding or rebound.      Hernia: No hernia is present.  Musculoskeletal:        General: Tenderness present. No swelling. Normal range of motion.     Cervical back: Normal range of motion and neck supple.     Right lower leg: No edema.     Left lower leg: No edema.     Comments:  Neg SLR bilaterally. + pain with int/ext rotation at hip. Pain at SIJ (+), GTB (++) and sciatic notch (++) on left  Lymphadenopathy:     Cervical: No cervical adenopathy.  Skin:    General: Skin is warm and dry.     Findings: No rash.  Neurological:     General: No focal deficit present.     Mental Status: She is alert and oriented to person, place, and time.  Comments: CN grossly intact, station and gait intact  Psychiatric:        Mood and Affect: Mood normal.        Behavior: Behavior normal.        Thought Content: Thought content normal.        Judgment: Judgment normal.       Results for orders placed or performed in visit on 09/28/18  HM MAMMOGRAPHY  Result Value Ref Range   HM Mammogram Self Reported Normal 0-4 Bi-Rad, Self Reported Normal  Lipid panel  Result Value Ref Range   Cholesterol 211 (H) 0 - 200 mg/dL   Triglycerides 244.0 0.0 - 149.0 mg/dL   HDL 10.27 >25.36 mg/dL   VLDL 64.4 0.0 - 03.4 mg/dL   LDL Cholesterol 742 (H) 0 - 99 mg/dL   Total CHOL/HDL Ratio 5    NonHDL 165.63   Basic metabolic panel  Result Value Ref Range   Sodium 141 135 - 145 mEq/L   Potassium 3.6 3.5 - 5.1 mEq/L   Chloride 106 96 - 112 mEq/L   CO2 25 19 - 32 mEq/L   Glucose, Bld 100 (H) 70 - 99 mg/dL   BUN 12 6 - 23 mg/dL   Creatinine, Ser 5.95 0.40 - 1.20 mg/dL   Calcium 9.2 8.4 - 63.8 mg/dL   GFR 75.64 >33.29 mL/min  Vitamin B12  Result Value Ref Range   Vitamin B-12 329 211 - 911 pg/mL  TSH  Result Value Ref Range   TSH 2.19 0.35 - 4.50 uIU/mL  T4, free  Result Value Ref Range   Free T4 0.70 0.60 - 1.60 ng/dL  VITAMIN D 25 Hydroxy (Vit-D Deficiency, Fractures)  Result Value Ref Range   VITD 22.60 (L) 30.00 -  100.00 ng/mL   Assessment & Plan:  This visit occurred during the SARS-CoV-2 public health emergency.  Safety protocols were in place, including screening questions prior to the visit, additional usage of staff PPE, and extensive cleaning of exam room while observing appropriate contact time as indicated for disinfecting solutions.   Problem List Items Addressed This Visit    Vitamin D deficiency    Continues weekly replacement - update vit D level.       Relevant Orders   VITAMIN D 25 Hydroxy (Vit-D Deficiency, Fractures)   Vitamin B12 deficiency    Off replacement - update level.       Relevant Orders   Vitamin B12   Postmenopausal    Now off estrogen - no noted benefit. Hot flashes have resolved.       Post-nasal drainage    Managing with PRN singulair.       Mild hyperlipidemia    Update labs off med The 10-year ASCVD risk score Denman George DC Montez Hageman., et al., 2013) is: 1.4%   Values used to calculate the score:     Age: 88 years     Sex: Female     Is Non-Hispanic African American: No     Diabetic: No     Tobacco smoker: No     Systolic Blood Pressure: 120 mmHg     Is BP treated: No     HDL Cholesterol: 45 mg/dL     Total Cholesterol: 211 mg/dL       Relevant Orders   Lipid panel   Basic metabolic panel   Left hip pain    Describes sciatica, bursitis, and hip OA pain, exam consistent with all 3. Check hip films for baseline. rec  voltaren gel to lateral hip. Consider SM eval for bursal steriod injection. Update if not improving or progressive worsening noted.       Relevant Orders   DG Hip Unilat W OR W/O Pelvis 2-3 Views Left   Hypothyroidism    Stable off replacement - update TSH, fT4      Relevant Orders   TSH   T4, free   Encounter for general adult medical examination with abnormal findings - Primary    Preventative protocols reviewed and updated unless pt declined. Discussed healthy diet and lifestyle.  Pt declines HIV screen - removed from HM section of  chart          Meds ordered this encounter  Medications  . Cholecalciferol (VITAMIN D3) 1.25 MG (50000 UT) TABS    Sig: Take 1 tablet by mouth once a week.    Dispense:  12 tablet    Refill:  3   Orders Placed This Encounter  Procedures  . DG Hip Unilat W OR W/O Pelvis 2-3 Views Left    Standing Status:   Future    Number of Occurrences:   1    Standing Expiration Date:   02/10/2021    Order Specific Question:   Reason for Exam (SYMPTOM  OR DIAGNOSIS REQUIRED)    Answer:   L groin pain eval hip OA    Order Specific Question:   Is patient pregnant?    Answer:   No    Order Specific Question:   Preferred imaging location?    Answer:   [facility]    Order Specific Question:   Radiology Contrast Protocol - do NOT remove file path    Answer:   [email or web address]  . Lipid panel  . TSH  . T4, free  . Basic metabolic panel  . Vitamin B12  . VITAMIN D 25 Hydroxy (Vit-D Deficiency, Fractures)    Patient instructions: Labs today Call insurance to see if they cover colonoscopy this year - if so let me know and I will place referral.  Sounds like you may have sciatica, hip bursitis, and hip arthritis. Try OTC topical voltaren gel to lateral hip (?bursitis). Let me know if no better for referral to sports medicine to consider steroid shot into bursa.  Hip xray today to evaluate for arthritis.  Good to see you today, call us with questions.   Follow up plan: Return in about 1 year (around 12/13/2020) for annual exam, prior fasting for blood work.  Eustaquio Boyden, MD

## 2019-12-14 NOTE — Assessment & Plan Note (Signed)
Describes sciatica, bursitis, and hip OA pain, exam consistent with all 3. Check hip films for baseline. rec voltaren gel to lateral hip. Consider SM eval for bursal steriod injection. Update if not improving or progressive worsening noted.

## 2019-12-14 NOTE — Assessment & Plan Note (Addendum)
Preventative protocols reviewed and updated unless pt declined. Discussed healthy diet and lifestyle.  Pt declines HIV screen - removed from HM section of chart

## 2019-12-14 NOTE — Assessment & Plan Note (Signed)
Now off estrogen - no noted benefit. Hot flashes have resolved.

## 2019-12-14 NOTE — Assessment & Plan Note (Signed)
Update labs off med The 10-year ASCVD risk score Denman George DC Montez Hageman., et al., 2013) is: 1.4%   Values used to calculate the score:     Age: 49 years     Sex: Female     Is Non-Hispanic African American: No     Diabetic: No     Tobacco smoker: No     Systolic Blood Pressure: 120 mmHg     Is BP treated: No     HDL Cholesterol: 45 mg/dL     Total Cholesterol: 211 mg/dL

## 2019-12-19 ENCOUNTER — Encounter: Payer: Self-pay | Admitting: Family Medicine

## 2019-12-20 ENCOUNTER — Other Ambulatory Visit: Payer: Self-pay | Admitting: Family Medicine

## 2019-12-20 MED ORDER — CYANOCOBALAMIN 500 MCG PO TABS: 500.0000 ug | ORAL_TABLET | Freq: Every day | ORAL | Status: DC

## 2019-12-20 NOTE — Telephone Encounter (Signed)
Replied via lab note.

## 2020-01-06 ENCOUNTER — Other Ambulatory Visit: Payer: Self-pay | Admitting: Obstetrics

## 2020-01-06 DIAGNOSIS — R928 Other abnormal and inconclusive findings on diagnostic imaging of breast: Secondary | ICD-10-CM

## 2020-01-11 ENCOUNTER — Ambulatory Visit: Payer: Commercial Managed Care - PPO

## 2020-01-11 ENCOUNTER — Ambulatory Visit
Admission: RE | Admit: 2020-01-11 | Discharge: 2020-01-11 | Disposition: A | Discharge status: Home / Self Care | Payer: Commercial Managed Care - PPO | Source: Ambulatory Visit | Attending: Obstetrics | Admitting: Obstetrics

## 2020-01-11 ENCOUNTER — Other Ambulatory Visit: Payer: Self-pay

## 2020-01-11 DIAGNOSIS — R928 Other abnormal and inconclusive findings on diagnostic imaging of breast: Secondary | ICD-10-CM

## 2020-03-26 ENCOUNTER — Encounter: Payer: Self-pay | Admitting: Family Medicine

## 2020-03-26 ENCOUNTER — Ambulatory Visit: Payer: Commercial Managed Care - PPO | Admitting: Family Medicine

## 2020-03-26 ENCOUNTER — Other Ambulatory Visit: Payer: Self-pay

## 2020-03-26 VITALS — BP 122/78 | HR 74 | Temp 97.8°F | Ht 64.5 in | Wt 190.8 lb

## 2020-03-26 DIAGNOSIS — M1612 Unilateral primary osteoarthritis, left hip: Secondary | ICD-10-CM

## 2020-03-26 DIAGNOSIS — M7062 Trochanteric bursitis, left hip: Secondary | ICD-10-CM

## 2020-03-26 DIAGNOSIS — M5416 Radiculopathy, lumbar region: Secondary | ICD-10-CM | POA: Diagnosis not present

## 2020-03-26 DIAGNOSIS — M25552 Pain in left hip: Secondary | ICD-10-CM

## 2020-03-26 DIAGNOSIS — G5702 Lesion of sciatic nerve, left lower limb: Secondary | ICD-10-CM

## 2020-03-26 MED ORDER — METHYLPREDNISOLONE ACETATE 40 MG/ML IJ SUSP
80.0000 mg | Freq: Once | INTRAMUSCULAR | Status: AC
  Administered 2020-03-26: 80 mg via INTRA_ARTICULAR

## 2020-03-26 NOTE — Progress Notes (Signed)
Lindsey Poser T. Blanton Kardell, MD, [place]  [facility] [place] [facility] [place], [address]  Phone: [phone]  FAX: [phone]  Lindsey Proctor - 49 y.o. female  MRN [id number]  Date of Birth: 1971-09-26  Date: 03/26/2020  PCP: Ria Bush, MD  Referral: Ria Bush, MD  Chief Complaint  Patient presents with  . Hip Pain    left, about 6 months    This visit occurred during the SARS-CoV-2 public health emergency.  Safety protocols were in place, including screening questions prior to the visit, additional usage of staff PPE, and extensive cleaning of exam room while observing appropriate contact time as indicated for disinfecting solutions.   Subjective:   Lindsey Proctor is a 49 y.o. very pleasant female patient with Body mass index is 32.24 kg/m. who presents with the following:  She presents with a 43-month history of multifactorial radicular symptoms, buttocks pain, groin pain, and lateral hip pain.  Her primary complaint right now is pain in the posterior pelvis as well as on the lateral hip which goes down to the point of her knee posteriorly and laterally.  She does not recall any specific trauma or injury.  She has not had any specific trauma, fractures, prior surgeries on the affected aspect of her lower extremities and back.  She has tried basic anti-inflammatories, Tylenol, and she typically does do some sciatica and piriformis stretching.  The radiological images were independently reviewed by myself in the office and results were reviewed with the patient. My independent interpretation of images: Left-sided hip, on the bilateral views she does have some significant moderate degree osteoarthritis on the left compared to a mild osteoarthritis on the right.  This is also seen on the frog-leg view, and the patient does have moderate osteoarthritis with subchondral sclerosis,  narrowing of the joint space as well as osteophytes.  No fractures or dislocations.  Electronically Signed  By: Owens Loffler, MD On: 03/26/2020 11:40 AM EDT   Review of Systems is noted in the HPI, as appropriate   Objective:   BP 122/78   Pulse 74   Temp 97.8 F (36.6 C) (Skin)   Ht 5' 4.5" (1.638 m)   Wt 190 lb 12 oz (86.5 kg)   SpO2 98%   BMI 32.24 kg/m    GEN: No acute distress; alert,appropriate. PULM: Breathing comfortably in no respiratory distress PSYCH: Normally interactive.   Range of motion at  the waist: Flexion: normal Extension: normal Lateral bending: normal Rotation: all normal  No echymosis or edema Rises to examination table with no difficulty Gait: non antalgic  Inspection/Deformity: N Paraspinus Tenderness: Modest  B Ankle Dorsiflexion (L5,4): 5/5 B Great Toe Dorsiflexion (L5,4): 5/5 Heel Walk (L5): WNL Toe Walk (S1): WNL Rise/Squat (L4): WNL  SENSORY B Medial Foot (L4): WNL B Dorsum (L5): WNL B Lateral (S1): WNL Light Touch: WNL Pinprick: WNL  REFLEXES Knee (L4): 2+ Ankle (S1): 2+  B SLR, seated: neg B SLR, supine: neg B Reverse FABER: Painful B Greater Troch: Dramatically painful  HIP EXAM: SIDE: Left ROM: Abduction, Flexion, Internal and External range of motion: Loss of 25 degrees of abduction.  She has an approximate 15 to 20 degrees of total internal and external range of motion which do cause her pain at terminal endpoints. GTB: Dramatic tenderness SLR: NEG Knees: No effusion Reverse FABER is notably tender Piriformis: Tender with direct palpation on the left  Str:  flexion: 4/5 abduction: 4+/5 adduction: 4/5    Radiology: CLINICAL DATA:  Left groin pain.  EXAM: DG HIP (WITH OR WITHOUT PELVIS) 2-3V LEFT  COMPARISON:  None.  FINDINGS: There is no evidence of hip fracture or dislocation. No lytic or blastic lesions are identified. There is moderate severity narrowing of the left hip joint. Subcentimeter  phleboliths are noted within the left hemipelvis.  IMPRESSION: 1. Moderate severity narrowing of the left hip joint without evidence of acute osseous abnormality.   Electronically Signed   By: Aram Candela M.D.   On: 12/14/2019 20:32  Assessment and Plan:     ICD-10-CM   1. Lumbar radiculopathy, acute  M54.16   2. Left hip pain  M25.552 methylPREDNISolone acetate (DEPO-MEDROL) injection 80 mg  3. Trochanteric bursitis of left hip  M70.62   4. Primary osteoarthritis of left hip  M16.12   5. Piriformis syndrome, left  G57.02    Multifactorial back pain with radicular symptoms, pain in the buttocks region and down her leg as well as laterally.  This is the primary concern and complaint with the patient.  Notable trochanteric bursitis on the left.  She also has some sciatica as well as piriformis.  She does have moderate degree of osteoarthritis of the left hip, but at this point this is a secondary pain center compared to the rest of her pain.   A rehabilitation program from the [employer] was reviewed with the patient face to face for their condition.   Aspiration/Injection Procedure Note Lindsey Proctor Jul 15, 1971 Date of procedure: 03/26/2020  Procedure: Large Joint Aspiration / Injection of Hip, Trochanteric Bursa, L Indications: Pain  Procedure Details Verbal consent obtained. Risks (including infection, potential atrophy), benefits, and alternatives reviewed. Greater trochanter sterilely prepped with Chloraprep. Ethyl Chloride used for anesthesia. 8 cc of Lidocaine 1% injected with 2 mL of Depo-Medrol 40 mg into trochanteric bursa at area of maximal tenderness at greater trochanter. Needle taken to bone to troch bursa, flows easily. Bursa massaged. No bleeding and no complications. Decreased pain after injection. Needle: 22 gauge spinal needle Medication: 2 mL of Depo-Medrol 40 mg, equaling Depo-Medrol 80 mg total   Follow-up: No  follow-ups on file.  Meds ordered this encounter  Medications  . methylPREDNISolone acetate (DEPO-MEDROL) injection 80 mg   There are no discontinued medications. No orders of the defined types were placed in this encounter.   Signed,  Elpidio Galea. Woodford Strege, MD   Outpatient Encounter Medications as of 03/26/2020  Medication Sig  . montelukast (SINGULAIR) 10 MG tablet TAKE ONE TABLET BY MOUTH AT BEDTIME (Patient taking differently: As needed)  . vitamin B-12 (CYANOCOBALAMIN) 500 MCG tablet Take 1 tablet (500 mcg total) by mouth daily.  . Cholecalciferol (VITAMIN D3) 1.25 MG (50000 UT) TABS Take 1 tablet by mouth once a week. (Patient not taking: Reported on 03/26/2020)  . [EXPIRED] methylPREDNISolone acetate (DEPO-MEDROL) injection 80 mg    No facility-administered encounter medications on file as of 03/26/2020.

## 2020-03-27 ENCOUNTER — Encounter: Payer: Self-pay | Admitting: Family Medicine

## 2020-03-28 ENCOUNTER — Ambulatory Visit: Payer: Commercial Managed Care - PPO | Admitting: Family Medicine

## 2020-04-01 ENCOUNTER — Other Ambulatory Visit: Payer: Self-pay | Admitting: Family Medicine

## 2020-04-05 ENCOUNTER — Other Ambulatory Visit: Payer: Self-pay | Admitting: Family Medicine

## 2020-04-05 NOTE — Telephone Encounter (Signed)
Last office visit 03/26/2020 for lumbar radiculopathy with Dr. Patsy Lager.  Not on current medication list.

## 2020-04-05 NOTE — Telephone Encounter (Signed)
Non-urgent.  I think this is probably reasonable, but since I have only seen her once, I think appropriate to involve [place].  I can always address if he can't over the next few days.

## 2020-04-10 ENCOUNTER — Encounter: Payer: Self-pay | Admitting: Family Medicine

## 2020-04-10 NOTE — Telephone Encounter (Signed)
Spoke with pt scheduling OV on 04/12/20 at 11:15.

## 2020-04-12 ENCOUNTER — Encounter: Payer: Self-pay | Admitting: Family Medicine

## 2020-04-12 ENCOUNTER — Ambulatory Visit: Payer: Commercial Managed Care - PPO | Admitting: Family Medicine

## 2020-04-12 ENCOUNTER — Other Ambulatory Visit: Payer: Self-pay

## 2020-04-12 VITALS — BP 136/78 | HR 74 | Temp 97.7°F | Ht 64.5 in | Wt 187.6 lb

## 2020-04-12 DIAGNOSIS — Z780 Asymptomatic menopausal state: Secondary | ICD-10-CM

## 2020-04-12 DIAGNOSIS — F418 Other specified anxiety disorders: Secondary | ICD-10-CM | POA: Diagnosis not present

## 2020-04-12 DIAGNOSIS — R21 Rash and other nonspecific skin eruption: Secondary | ICD-10-CM

## 2020-04-12 DIAGNOSIS — M25552 Pain in left hip: Secondary | ICD-10-CM

## 2020-04-12 MED ORDER — VENLAFAXINE HCL ER 37.5 MG PO CP24: 37.5000 mg | ORAL_CAPSULE | Freq: Every day | ORAL | 3 refills | Status: DC

## 2020-04-12 MED ORDER — PREDNISONE 20 MG PO TABS: ORAL_TABLET | ORAL | 0 refills | Status: DC

## 2020-04-12 MED ORDER — TRIAMCINOLONE ACETONIDE 0.1 % EX CREA: 1.0000 "application " | TOPICAL_CREAM | Freq: Two times a day (BID) | CUTANEOUS | 0 refills | Status: DC

## 2020-04-12 NOTE — Patient Instructions (Addendum)
You have alot on your plate! Let's treat with steroid course (prednisone sent to pharmacy) for both rash and sciatica.  Let me know if prednisone not helping leg pain to discuss medicine for break throughpain.  Start mood medicine effexor 37.5mg  daily in the morning.  Return in 1 month for follow up visit, sooner if needed.

## 2020-04-13 NOTE — Telephone Encounter (Signed)
Patient seen this week

## 2020-04-14 ENCOUNTER — Encounter: Payer: Self-pay | Admitting: Family Medicine

## 2020-04-14 DIAGNOSIS — R21 Rash and other nonspecific skin eruption: Secondary | ICD-10-CM | POA: Insufficient documentation

## 2020-04-14 DIAGNOSIS — F418 Other specified anxiety disorders: Secondary | ICD-10-CM | POA: Insufficient documentation

## 2020-04-14 NOTE — Assessment & Plan Note (Signed)
Struggling with hot flashes, has stopped HRT. Will trial effexor.

## 2020-04-14 NOTE — Assessment & Plan Note (Signed)
Complex L hip pain with sciatica, bursitis and osteoarthritis. She received hip bursitis injection (without great response) and continues voltaren tablets and sciatica exercises. Will Rx prednisone course hopefully to help pain (hold voltaren PO while on steroids).

## 2020-04-14 NOTE — Assessment & Plan Note (Signed)
Struggling with mood, ongoing for months.  Interested in pharmacotherapy - will start effexor XR 37.5mg  daily for the next month then return in 1-2 months for close f/u. Reviewed common side effects to watch for as well as possibility of increased suicidality. Currently without SI/HI.

## 2020-04-14 NOTE — Assessment & Plan Note (Signed)
Not typical of shingles rash however there were paresthesias to skin prior to rash developing, it is tender, and it did develop during period of high stress. Anticipate likely herpes zoster, ongoing for 2wks now, outside of window for antiviral therapeutic benefit. Will Rx prednisone course and provide with topical triamcinolone cream. Update if not improving with treatment. Immune to shingles recurrence for 1 year but will then recommend shingrix vaccine.

## 2020-04-14 NOTE — Progress Notes (Signed)
This visit was conducted in person.  BP 136/78 (BP Location: Left Arm, Patient Position: Sitting, Cuff Size: Normal)   Pulse 74   Temp 97.7 F (36.5 C) (Temporal)   Ht 5' 4.5" (1.638 m)   Wt 187 lb 9 oz (85.1 kg)   SpO2 98%   BMI 31.70 kg/m    CC: check rash, discuss stressors Subjective:    Patient ID: Lindsey Proctor, female    DOB: Feb 04, 1971, 49 y.o.   MRN: [id number]  HPI: Carlisia Proctor is a 49 y.o. female presenting on 04/12/2020 for Rash (C/o rash on left side.  Noticed 03/26/20. Area is itchy and has become dark red.  Had cortisone inj in left hip on 03/26/20.)   2+ wk h/o L sided skin rash that may have started 1 wk after L hip bursal steroid injection 03/26/2020. She had traumatic experience with recent bursal injection - very painful. Had bruising to area, now improving. Given sciatica stretches which she is doing. She has been taking oral diclofenac with benefit.   Describes smooth itchy rash to left side. It is tender to touch and she did have discomfort and sensitivity to skin on side of abdomen prior to rash developing. She has treated with calomine lotion and benadryl gel, hydrocortisone cream as well as oral benadryl with temporary relief. Also used epsom salt and aveeno baths. See mychart photo.  Denies change in lotions, detergents, soaps or shampoos. No new meds besides recent steroid injection. No new vitamins or supplements.   Currently L sciatica pain > L hip arthritis and bursitis pains.   Increased stress at work.  Approaching anniversary of father's death 04-30-19).  Continues caring for her mother.  Ongoing menopausal symptoms - stopped estrogen and progesterone as it wasn't effective.  She is very involved in church - this helps.   Worsening anxiety/depression.  Requests derm referral for husband's L side skin lesion.      Relevant past medical, surgical, family and social history reviewed and updated as indicated. Interim medical  history since our last visit reviewed. Allergies and medications reviewed and updated. Outpatient Medications Prior to Visit  Medication Sig Dispense Refill  . Cholecalciferol (VITAMIN D3) 1.25 MG (50000 UT) TABS Take 1 tablet by mouth once a week. 12 tablet 3  . diclofenac (VOLTAREN) 75 MG EC tablet Take 1 tablet by mouth twice daily 30 tablet 0  . montelukast (SINGULAIR) 10 MG tablet TAKE ONE TABLET BY MOUTH AT BEDTIME (Patient taking differently: As needed) 30 tablet 5  . vitamin B-12 (CYANOCOBALAMIN) 500 MCG tablet Take 1 tablet (500 mcg total) by mouth daily.     No facility-administered medications prior to visit.     Per HPI unless specifically indicated in ROS section below Review of Systems Objective:  BP 136/78 (BP Location: Left Arm, Patient Position: Sitting, Cuff Size: Normal)   Pulse 74   Temp 97.7 F (36.5 C) (Temporal)   Ht 5' 4.5" (1.638 m)   Wt 187 lb 9 oz (85.1 kg)   SpO2 98%   BMI 31.70 kg/m   Wt Readings from Last 3 Encounters:  04/12/20 187 lb 9 oz (85.1 kg)  03/26/20 190 lb 12 oz (86.5 kg)  12/14/19 181 lb 9 oz (82.4 kg)      Physical Exam Vitals and nursing note reviewed.  Constitutional:      Appearance: Normal appearance. She is not ill-appearing.  Skin:    General: Skin is warm and dry.  Findings: Erythema and rash present.     Comments: Splotchy erythematous rough patches along L lateral side of abdomen with smaller papular spots to left anterior lower abdomen along with healing bruising, not vesicular but tender to touch and pruritic   Neurological:     Mental Status: She is alert.  Psychiatric:        Mood and Affect: Mood normal.        Behavior: Behavior normal.         Depression screen [facility] 2/9 12/14/2019 09/28/2018 08/28/2017  Decreased Interest 0 0 0  Down, Depressed, Hopeless 0 0 0  PHQ - 2 Score 0 0 0   No flowsheet data found.  Assessment & Plan:  This visit occurred during the SARS-CoV-2 public health emergency.  Safety  protocols were in place, including screening questions prior to the visit, additional usage of staff PPE, and extensive cleaning of exam room while observing appropriate contact time as indicated for disinfecting solutions.   Problem List Items Addressed This Visit    Skin rash - Primary    Not typical of shingles rash however there were paresthesias to skin prior to rash developing, it is tender, and it did develop during period of high stress. Anticipate likely herpes zoster, ongoing for 2wks now, outside of window for antiviral therapeutic benefit. Will Rx prednisone course and provide with topical triamcinolone cream. Update if not improving with treatment. Immune to shingles recurrence for 1 year but will then recommend shingrix vaccine.      Postmenopausal    Struggling with hot flashes, has stopped HRT. Will trial effexor.       Left hip pain    Complex L hip pain with sciatica, bursitis and osteoarthritis. She received hip bursitis injection (without great response) and continues voltaren tablets and sciatica exercises. Will Rx prednisone course hopefully to help pain (hold voltaren PO while on steroids).       Depression with anxiety    Struggling with mood, ongoing for months.  Interested in pharmacotherapy - will start effexor XR 37.5mg  daily for the next month then return in 1-2 months for close f/u. Reviewed common side effects to watch for as well as possibility of increased suicidality. Currently without SI/HI.       Relevant Medications   venlafaxine XR (EFFEXOR XR) 37.5 MG 24 hr capsule       Meds ordered this encounter  Medications  . venlafaxine XR (EFFEXOR XR) 37.5 MG 24 hr capsule    Sig: Take 1 capsule (37.5 mg total) by mouth daily with breakfast.    Dispense:  30 capsule    Refill:  3  . predniSONE (DELTASONE) 20 MG tablet    Sig: Take two tablets daily for 3 days followed by one tablet daily for 4 days    Dispense:  10 tablet    Refill:  0  . triamcinolone  cream (KENALOG) 0.1 %    Sig: Apply 1 application topically 2 (two) times daily. Apply to AA.    Dispense:  45 g    Refill:  0   No orders of the defined types were placed in this encounter.   Patient Instructions  You have alot on your plate! Let's treat with steroid course (prednisone sent to pharmacy) for both rash and sciatica.  Let me know if prednisone not helping leg pain to discuss medicine for break throughpain.  Start mood medicine effexor 37.5mg  daily in the morning.  Return in 1 month for follow  up visit, sooner if needed.    Follow up plan: Return in about 4 weeks (around 05/10/2020) for follow up visit.  Eustaquio Boyden, MD

## 2020-07-22 ENCOUNTER — Other Ambulatory Visit: Payer: Self-pay | Admitting: Family Medicine

## 2020-07-25 NOTE — Telephone Encounter (Signed)
Voltaren tab Last filled:  04/06/20, #30 Last OV:  04/12/20, rash Next OV:  none

## 2020-08-18 ENCOUNTER — Telehealth: Payer: Self-pay | Admitting: Family Medicine

## 2020-08-21 NOTE — Telephone Encounter (Signed)
Noted  

## 2020-08-21 NOTE — Telephone Encounter (Signed)
Patient is scheduled   

## 2020-08-21 NOTE — Telephone Encounter (Signed)
E-scribed refill.  Plz schedule mood f/u. (was supposed to f/u by 05/2020)

## 2020-09-03 ENCOUNTER — Ambulatory Visit: Payer: Commercial Managed Care - PPO | Admitting: Family Medicine

## 2020-09-03 ENCOUNTER — Other Ambulatory Visit: Payer: Self-pay

## 2020-09-03 ENCOUNTER — Encounter: Payer: Self-pay | Admitting: Family Medicine

## 2020-09-03 VITALS — BP 132/90 | HR 68 | Temp 97.6°F | Resp 16 | Ht 64.49 in | Wt 196.2 lb

## 2020-09-03 DIAGNOSIS — F418 Other specified anxiety disorders: Secondary | ICD-10-CM | POA: Diagnosis not present

## 2020-09-03 DIAGNOSIS — Z1211 Encounter for screening for malignant neoplasm of colon: Secondary | ICD-10-CM | POA: Diagnosis not present

## 2020-09-03 DIAGNOSIS — M25552 Pain in left hip: Secondary | ICD-10-CM | POA: Diagnosis not present

## 2020-09-03 DIAGNOSIS — R053 Chronic cough: Secondary | ICD-10-CM | POA: Diagnosis not present

## 2020-09-03 MED ORDER — MONTELUKAST SODIUM 10 MG PO TABS: 10.0000 mg | ORAL_TABLET | Freq: Every day | ORAL | 11 refills | Status: DC

## 2020-09-03 MED ORDER — DULOXETINE HCL 30 MG PO CPEP: 30.0000 mg | ORAL_CAPSULE | Freq: Every day | ORAL | 6 refills | Status: DC

## 2020-09-03 NOTE — Assessment & Plan Note (Signed)
Complex L hip pain, ongoing. Sciatica symptoms seem to have largely resolved but bursitis and OA pain persists. She didn't respond to bursitis steroid injection. She is interested in evaluation by ortho ([facility]) - so will refer. Requests appt after 11/17/2020.

## 2020-09-03 NOTE — Assessment & Plan Note (Signed)
Ongoing struggle, doesn't like withdrawal symptoms if late with effexor - will transition from effexor SNRI to cymbalta 30mg  daily. Update with effect. Pt agrees with plan.

## 2020-09-03 NOTE — Progress Notes (Signed)
This visit was conducted in person.  BP 132/90 (BP Location: Left Arm, Patient Position: Sitting)   Pulse 68   Temp 97.6 F (36.4 C)   Resp 16   Ht 5' 4.49" (1.638 m)   Wt 196 lb 3.2 oz (89 kg)   SpO2 98%   BMI 33.17 kg/m    CC: med refill visit  Subjective:    Patient ID: Lindsey Proctor, female    DOB: Oct 26, 1971, 49 y.o.   MRN: [id number]  HPI: Lindsey Proctor is a 49 y.o. female presenting on 09/03/2020 for Medication Refill (Montelukast and Venlafaxine)   Depression with anxiety - last visit we started effexor XR 37.5mg  daily (03/2020). She feels this has been helpful but isn't sure that it's helped much. Ongoing tense, agitation. Ongoing depressed mood as well as increased worrying. Sleeps well. Appetite ok. Low energy, low concentration. No anhedonia. No guilt. No SI/HI. No psychomotor agitation/retardation.   Ongoing L hip pain - hip bursal injection didn't help and was very painful. Interested to see Dr Despina Hick in 2022. Continues voltaren PO as well as ibuprofen. Mobility very limited.   Requests singulair refill.  Requests GI referral for colonoscopy.      Relevant past medical, surgical, family and social history reviewed and updated as indicated. Interim medical history since our last visit reviewed. Allergies and medications reviewed and updated. Outpatient Medications Prior to Visit  Medication Sig Dispense Refill  . Cholecalciferol (VITAMIN D3) 1.25 MG (50000 UT) TABS Take 1 tablet by mouth once a week. 12 tablet 3  . diclofenac (VOLTAREN) 75 MG EC tablet Take 1 tablet by mouth twice daily 30 tablet 0  . vitamin B-12 (CYANOCOBALAMIN) 500 MCG tablet Take 1 tablet (500 mcg total) by mouth daily.    . montelukast (SINGULAIR) 10 MG tablet TAKE ONE TABLET BY MOUTH AT BEDTIME (Patient taking differently: As needed) 30 tablet 5  . venlafaxine XR (EFFEXOR-XR) 37.5 MG 24 hr capsule TAKE 1 CAPSULE BY MOUTH ONCE DAILY WITH BREAKFAST 30 capsule 0  . predniSONE  (DELTASONE) 20 MG tablet Take two tablets daily for 3 days followed by one tablet daily for 4 days (Patient not taking: Reported on 09/03/2020) 10 tablet 0  . triamcinolone cream (KENALOG) 0.1 % Apply 1 application topically 2 (two) times daily. Apply to AA. (Patient not taking: Reported on 09/03/2020) 45 g 0   No facility-administered medications prior to visit.     Per HPI unless specifically indicated in ROS section below Review of Systems Objective:  BP 132/90 (BP Location: Left Arm, Patient Position: Sitting)   Pulse 68   Temp 97.6 F (36.4 C)   Resp 16   Ht 5' 4.49" (1.638 m)   Wt 196 lb 3.2 oz (89 kg)   SpO2 98%   BMI 33.17 kg/m   Wt Readings from Last 3 Encounters:  09/03/20 196 lb 3.2 oz (89 kg)  04/12/20 187 lb 9 oz (85.1 kg)  03/26/20 190 lb 12 oz (86.5 kg)      Physical Exam Vitals and nursing note reviewed.  Constitutional:      Appearance: Normal appearance. She is not ill-appearing.  Neurological:     Mental Status: She is alert.  Psychiatric:        Mood and Affect: Mood normal.        Behavior: Behavior normal.       Results for orders placed or performed in visit on 12/14/19  Lipid panel  Result Value Ref Range  Cholesterol 210 (H) 0 - 200 mg/dL   Triglycerides 62.8 0 - 149 mg/dL   HDL 36.62 >94.76 mg/dL   VLDL 54.6 0.0 - 50.3 mg/dL   LDL Cholesterol 546 (H) 0 - 99 mg/dL   Total CHOL/HDL Ratio 5    NonHDL 164.36   TSH  Result Value Ref Range   TSH 2.19 0.35 - 4.50 uIU/mL  T4, free  Result Value Ref Range   Free T4 0.71 0.60 - 1.60 ng/dL  Basic metabolic panel  Result Value Ref Range   Sodium 139 135 - 145 mEq/L   Potassium 3.8 3.5 - 5.1 mEq/L   Chloride 105 96 - 112 mEq/L   CO2 28 19 - 32 mEq/L   Glucose, Bld 92 70 - 99 mg/dL   BUN 21 6 - 23 mg/dL   Creatinine, Ser 5.68 0.40 - 1.20 mg/dL   GFR 12.75 >17.00 mL/min   Calcium 9.5 8.4 - 10.5 mg/dL  Vitamin F74  Result Value Ref Range   Vitamin B-12 263 211 - 911 pg/mL  VITAMIN D 25  Hydroxy (Vit-D Deficiency, Fractures)  Result Value Ref Range   VITD 40.35 30.00 - 100.00 ng/mL   Depression screen [facility] 2/9 09/03/2020 09/03/2020 12/14/2019 09/28/2018 08/28/2017  Decreased Interest 1 0 0 0 0  Down, Depressed, Hopeless 1 0 0 0 0  PHQ - 2 Score 2 0 0 0 0  Altered sleeping 1 - - - -  Tired, decreased energy 3 - - - -  Change in appetite 1 - - - -  Feeling bad or failure about yourself  2 - - - -  Trouble concentrating 2 - - - -  Moving slowly or fidgety/restless 1 - - - -  Suicidal thoughts 0 - - - -  PHQ-9 Score 12 - - - -    GAD 7 : Generalized Anxiety Score 09/03/2020  Nervous, Anxious, on Edge 2  Control/stop worrying 2  Worry too much - different things 2  Trouble relaxing 2  Restless 0  Easily annoyed or irritable 3  Afraid - awful might happen 0  Total GAD 7 Score 11   Assessment & Plan:  This visit occurred during the SARS-CoV-2 public health emergency.  Safety protocols were in place, including screening questions prior to the visit, additional usage of staff PPE, and extensive cleaning of exam room while observing appropriate contact time as indicated for disinfecting solutions.   Problem List Items Addressed This Visit    Left hip pain    Complex L hip pain, ongoing. Sciatica symptoms seem to have largely resolved but bursitis and OA pain persists. She didn't respond to bursitis steroid injection. She is interested in evaluation by ortho ([facility]) - so will refer. Requests appt after 11/17/2020.       Relevant Orders   Ambulatory referral to Orthopedic Surgery   Depression with anxiety - Primary    Ongoing struggle, doesn't like withdrawal symptoms if late with effexor - will transition from effexor SNRI to cymbalta 30mg  daily. Update with effect. Pt agrees with plan.       Relevant Medications   DULoxetine (CYMBALTA) 30 MG capsule   Chronic cough    Refill singulair - uses PRN.        Other Visit Diagnoses    Special screening for malignant  neoplasms, colon       Relevant Orders   Ambulatory referral to Gastroenterology       Meds ordered this encounter  Medications  .  DULoxetine (CYMBALTA) 30 MG capsule    Sig: Take 1 capsule (30 mg total) by mouth daily.    Dispense:  30 capsule    Refill:  6    To replace effexor XR  . montelukast (SINGULAIR) 10 MG tablet    Sig: Take 1 tablet (10 mg total) by mouth at bedtime.    Dispense:  30 tablet    Refill:  11   Orders Placed This Encounter  Procedures  . Ambulatory referral to Orthopedic Surgery    Referral Priority:   Routine    Referral Type:   Surgical    Referral Reason:   Specialty Services Required    Requested Specialty:   Orthopedic Surgery    Number of Visits Requested:   1  . Ambulatory referral to Gastroenterology    Referral Priority:   Routine    Referral Type:   Consultation    Referral Reason:   Specialty Services Required    Number of Visits Requested:   1    Patient Instructions  Let's transition from effexor XR to cymbalta 30mg  daily sent to pharmacy. Let me know how you do with this.  We will refer you to Dr . singulair refilled. Good to see you today, call Despina Hick with questions. Return in ~4 months for physical.   Follow up plan: Return in about 4 months (around 01/04/2021) for annual exam, prior fasting for blood work.  01/06/2021, MD

## 2020-09-03 NOTE — Assessment & Plan Note (Addendum)
Refill singulair - uses PRN.

## 2020-09-03 NOTE — Patient Instructions (Addendum)
Let's transition from effexor XR to cymbalta 30mg  daily sent to pharmacy. Let me know how you do with this.  We will refer you to Dr . singulair refilled. Good to see you today, call Despina Hick with questions. Return in ~4 months for physical.

## 2020-09-18 ENCOUNTER — Encounter: Payer: Self-pay | Admitting: Family Medicine

## 2020-09-18 NOTE — Telephone Encounter (Signed)
plz let pt know what she needs to do to pick up a disc with recent hip xray. Thanks,

## 2020-09-20 ENCOUNTER — Telehealth (INDEPENDENT_AMBULATORY_CARE_PROVIDER_SITE_OTHER): Payer: Self-pay | Admitting: Gastroenterology

## 2020-09-20 ENCOUNTER — Other Ambulatory Visit: Payer: Self-pay

## 2020-09-20 DIAGNOSIS — Z1211 Encounter for screening for malignant neoplasm of colon: Secondary | ICD-10-CM

## 2020-09-20 MED ORDER — NA SULFATE-K SULFATE-MG SULF 17.5-3.13-1.6 GM/177ML PO SOLN: 1.0000 | Freq: Once | ORAL | 0 refills | Status: AC

## 2020-09-20 NOTE — Progress Notes (Signed)
Gastroenterology Pre-Procedure Review  Request Date: Friday 11/02/20 Requesting Physician: Dr. Allen Norris  PATIENT REVIEW QUESTIONS: The patient responded to the following health history questions as indicated:    1. Are you having any GI issues? no 2. Do you have a personal history of Polyps? no 3. Do you have a family history of Colon Cancer or Polyps? no 4. Diabetes Mellitus? no 5. Joint replacements in the past 12 months?no 6. Major health problems in the past 3 months?no 7. Any artificial heart valves, MVP, or defibrillator?no    MEDICATIONS & ALLERGIES:    Patient reports the following regarding taking any anticoagulation/antiplatelet therapy:   Plavix, Coumadin, Eliquis, Xarelto, Lovenox, Pradaxa, Brilinta, or Effient? no Aspirin? no  Patient confirms/reports the following medications:  Current Outpatient Medications  Medication Sig Dispense Refill  . Cholecalciferol (VITAMIN D3) 1.25 MG (50000 UT) TABS Take 1 tablet by mouth once a week. 12 tablet 3  . diclofenac (VOLTAREN) 75 MG EC tablet Take 1 tablet by mouth twice daily 30 tablet 0  . DULoxetine (CYMBALTA) 30 MG capsule Take 1 capsule (30 mg total) by mouth daily. 30 capsule 6  . ESTROGENS CONJUGATED PO Take by mouth daily.    . montelukast (SINGULAIR) 10 MG tablet Take 1 tablet (10 mg total) by mouth at bedtime. 30 tablet 11  . PROGESTERONE PO Take by mouth daily.    . vitamin B-12 (CYANOCOBALAMIN) 500 MCG tablet Take 1 tablet (500 mcg total) by mouth daily.    . Na Sulfate-K Sulfate-Mg Sulf 17.5-3.13-1.6 GM/177ML SOLN Take 1 kit by mouth once for 1 dose. 354 mL 0   No current facility-administered medications for this visit.    Patient confirms/reports the following allergies:  No Known Allergies  No orders of the defined types were placed in this encounter.   AUTHORIZATION INFORMATION Primary Insurance: 1D#: Group #:  Secondary Insurance: 1D#: Group #:  SCHEDULE INFORMATION: Date:  10/23/20 Time: Location:[facility]

## 2020-10-23 ENCOUNTER — Encounter: Payer: Self-pay | Admitting: Gastroenterology

## 2020-10-29 ENCOUNTER — Telehealth: Payer: Self-pay

## 2020-10-29 NOTE — Telephone Encounter (Signed)
RETURNED PATIENTS CALL. UNSURE WHAT PATIENT NEEDED. LVM TO CALL OFFICE BACK.

## 2020-10-31 ENCOUNTER — Other Ambulatory Visit
Admission: RE | Admit: 2020-10-31 | Discharge: 2020-10-31 | Disposition: A | Discharge status: Home / Self Care | Payer: Commercial Managed Care - PPO | Source: Ambulatory Visit | Attending: Gastroenterology | Admitting: Gastroenterology

## 2020-10-31 ENCOUNTER — Telehealth: Payer: Self-pay

## 2020-10-31 DIAGNOSIS — Z20822 Contact with and (suspected) exposure to covid-19: Secondary | ICD-10-CM | POA: Diagnosis not present

## 2020-10-31 DIAGNOSIS — Z01812 Encounter for preprocedural laboratory examination: Secondary | ICD-10-CM | POA: Insufficient documentation

## 2020-10-31 LAB — SARS CORONAVIRUS 2 (TAT 6-24 HRS): SARS Coronavirus 2: NEGATIVE

## 2020-10-31 NOTE — Telephone Encounter (Signed)
Returned patient's call. Pt had questions regarding the prep and clear liquid diet. Questions were answered and pt verbalized understanding.

## 2020-11-01 NOTE — Discharge Instructions (Signed)
General Anesthesia, Adult, Care After This sheet gives you information about how to care for yourself after your procedure. Your health care provider may also give you more specific instructions. If you have problems or questions, contact your health care provider. What can I expect after the procedure? After the procedure, the following side effects are common:  Pain or discomfort at the IV site.  Nausea.  Vomiting.  Sore throat.  Trouble concentrating.  Feeling cold or chills.  Weak or tired.  Sleepiness and fatigue.  Soreness and body aches. These side effects can affect parts of the body that were not involved in surgery. Follow these instructions at home:  For at least 24 hours after the procedure:  Have a responsible adult stay with you. It is important to have someone help care for you until you are awake and alert.  Rest as needed.  Do not: ? Participate in activities in which you could fall or become injured. ? Drive. ? Use heavy machinery. ? Drink alcohol. ? Take sleeping pills or medicines that cause drowsiness. ? Make important decisions or sign legal documents. ? Take care of children on your own. Eating and drinking  Follow any instructions from your health care provider about eating or drinking restrictions.  When you feel hungry, start by eating small amounts of foods that are soft and easy to digest (bland), such as toast. Gradually return to your regular diet.  Drink enough fluid to keep your urine pale yellow.  If you vomit, rehydrate by drinking water, juice, or clear broth. General instructions  If you have sleep apnea, surgery and certain medicines can increase your risk for breathing problems. Follow instructions from your health care provider about wearing your sleep device: ? Anytime you are sleeping, including during daytime naps. ? While taking prescription pain medicines, sleeping medicines, or medicines that make you drowsy.  Return to  your normal activities as told by your health care provider. Ask your health care provider what activities are safe for you.  Take over-the-counter and prescription medicines only as told by your health care provider.  If you smoke, do not smoke without supervision.  Keep all follow-up visits as told by your health care provider. This is important. Contact a health care provider if:  You have nausea or vomiting that does not get better with medicine.  You cannot eat or drink without vomiting.  You have pain that does not get better with medicine.  You are unable to pass urine.  You develop a skin rash.  You have a fever.  You have redness around your IV site that gets worse. Get help right away if:  You have difficulty breathing.  You have chest pain.  You have blood in your urine or stool, or you vomit blood. Summary  After the procedure, it is common to have a sore throat or nausea. It is also common to feel tired.  Have a responsible adult stay with you for the first 24 hours after general anesthesia. It is important to have someone help care for you until you are awake and alert.  When you feel hungry, start by eating small amounts of foods that are soft and easy to digest (bland), such as toast. Gradually return to your regular diet.  Drink enough fluid to keep your urine pale yellow.  Return to your normal activities as told by your health care provider. Ask your health care provider what activities are safe for you. This information is not   intended to replace advice given to you by your health care provider. Make sure you discuss any questions you have with your health care provider. Document Revised: 11/06/2017 Document Reviewed: 06/19/2017 Elsevier Patient Education  2020 [employer]  

## 2020-11-02 ENCOUNTER — Ambulatory Visit: Payer: Commercial Managed Care - PPO | Admitting: Anesthesiology

## 2020-11-02 ENCOUNTER — Encounter
Admission: RE | Disposition: A | Discharge status: Home / Self Care | Payer: Self-pay | Source: Home / Self Care | Attending: Gastroenterology

## 2020-11-02 ENCOUNTER — Other Ambulatory Visit: Payer: Self-pay

## 2020-11-02 ENCOUNTER — Encounter: Payer: Self-pay | Admitting: Gastroenterology

## 2020-11-02 ENCOUNTER — Ambulatory Visit
Admission: RE | Admit: 2020-11-02 | Discharge: 2020-11-02 | Disposition: A | Discharge status: Home / Self Care | Payer: Commercial Managed Care - PPO | Attending: Gastroenterology | Admitting: Gastroenterology

## 2020-11-02 DIAGNOSIS — Z791 Long term (current) use of non-steroidal anti-inflammatories (NSAID): Secondary | ICD-10-CM | POA: Diagnosis not present

## 2020-11-02 DIAGNOSIS — K640 First degree hemorrhoids: Secondary | ICD-10-CM | POA: Insufficient documentation

## 2020-11-02 DIAGNOSIS — K5730 Diverticulosis of large intestine without perforation or abscess without bleeding: Secondary | ICD-10-CM | POA: Insufficient documentation

## 2020-11-02 DIAGNOSIS — Z1211 Encounter for screening for malignant neoplasm of colon: Secondary | ICD-10-CM | POA: Diagnosis not present

## 2020-11-02 DIAGNOSIS — K635 Polyp of colon: Secondary | ICD-10-CM

## 2020-11-02 DIAGNOSIS — Z825 Family history of asthma and other chronic lower respiratory diseases: Secondary | ICD-10-CM | POA: Insufficient documentation

## 2020-11-02 DIAGNOSIS — Z833 Family history of diabetes mellitus: Secondary | ICD-10-CM | POA: Insufficient documentation

## 2020-11-02 DIAGNOSIS — Z8616 Personal history of COVID-19: Secondary | ICD-10-CM | POA: Diagnosis not present

## 2020-11-02 DIAGNOSIS — Z79899 Other long term (current) drug therapy: Secondary | ICD-10-CM | POA: Insufficient documentation

## 2020-11-02 HISTORY — PX: POLYPECTOMY: SHX5525

## 2020-11-02 HISTORY — PX: COLONOSCOPY WITH PROPOFOL: SHX5780

## 2020-11-02 SURGERY — COLONOSCOPY WITH PROPOFOL
Anesthesia: General

## 2020-11-02 MED ORDER — LACTATED RINGERS IV SOLN: INTRAVENOUS | Status: DC

## 2020-11-02 MED ORDER — STERILE WATER FOR IRRIGATION IR SOLN: Status: DC | PRN

## 2020-11-02 MED ORDER — PROPOFOL 10 MG/ML IV BOLUS
INTRAVENOUS | Status: DC | PRN
  Administered 2020-11-02: 50 mg via INTRAVENOUS
  Administered 2020-11-02: 150 mg via INTRAVENOUS
  Administered 2020-11-02: 50 mg via INTRAVENOUS
  Administered 2020-11-02 (×2): 40 mg via INTRAVENOUS

## 2020-11-02 MED ORDER — LIDOCAINE HCL (CARDIAC) PF 100 MG/5ML IV SOSY
PREFILLED_SYRINGE | INTRAVENOUS | Status: DC | PRN
  Administered 2020-11-02: 30 mg via INTRAVENOUS

## 2020-11-02 MED ORDER — PROMETHAZINE HCL 25 MG/ML IJ SOLN: 6.2500 mg | INTRAMUSCULAR | Status: DC | PRN

## 2020-11-02 MED ORDER — FENTANYL CITRATE (PF) 100 MCG/2ML IJ SOLN: 25.0000 ug | INTRAMUSCULAR | Status: DC | PRN

## 2020-11-02 MED ORDER — MEPERIDINE HCL 25 MG/ML IJ SOLN: 6.2500 mg | INTRAMUSCULAR | Status: DC | PRN

## 2020-11-02 MED ORDER — OXYCODONE HCL 5 MG PO TABS
5.0000 mg | ORAL_TABLET | Freq: Once | ORAL | Status: DC | PRN
Start: 2020-11-02 — End: 2020-11-02

## 2020-11-02 MED ORDER — OXYCODONE HCL 5 MG/5ML PO SOLN
5.0000 mg | Freq: Once | ORAL | Status: DC | PRN
Start: 2020-11-02 — End: 2020-11-02

## 2020-11-02 SURGICAL SUPPLY — 25 items
CLIP HMST 235XBRD CATH ROT (MISCELLANEOUS) IMPLANT
CLIP RESOLUTION 360 11X235 (MISCELLANEOUS)
ELECT REM PT RETURN 9FT ADLT (ELECTROSURGICAL)
ELECTRODE REM PT RTRN 9FT ADLT (ELECTROSURGICAL) IMPLANT
FCP ESCP3.2XJMB 240X2.8X (MISCELLANEOUS)
FORCEPS BIOP RAD 4 LRG CAP 4 (CUTTING FORCEPS) ×4 IMPLANT
FORCEPS BIOP RJ4 240 W/NDL (MISCELLANEOUS)
FORCEPS ESCP3.2XJMB 240X2.8X (MISCELLANEOUS) IMPLANT
GOWN CVR UNV OPN BCK APRN NK (MISCELLANEOUS) ×4 IMPLANT
GOWN ISOL THUMB LOOP REG UNIV (MISCELLANEOUS) ×8
INJECTOR VARIJECT VIN23 (MISCELLANEOUS) IMPLANT
KIT DEFENDO VALVE AND CONN (KITS) IMPLANT
KIT PRC NS LF DISP ENDO (KITS) ×2 IMPLANT
KIT PROCEDURE OLYMPUS (KITS) ×4
MANIFOLD NEPTUNE II (INSTRUMENTS) ×4 IMPLANT
MARKER SPOT ENDO TATTOO 5ML (MISCELLANEOUS) IMPLANT
PROBE APC STR FIRE (PROBE) IMPLANT
RETRIEVER NET ROTH 2.5X230 LF (MISCELLANEOUS) IMPLANT
SNARE SHORT THROW 13M SML OVAL (MISCELLANEOUS) IMPLANT
SNARE SHORT THROW 30M LRG OVAL (MISCELLANEOUS) IMPLANT
SNARE SNG USE RND 15MM (INSTRUMENTS) IMPLANT
SPOT EX ENDOSCOPIC TATTOO (MISCELLANEOUS)
TRAP ETRAP POLY (MISCELLANEOUS) IMPLANT
VARIJECT INJECTOR VIN23 (MISCELLANEOUS)
WATER STERILE IRR 250ML POUR (IV SOLUTION) ×4 IMPLANT

## 2020-11-02 NOTE — Anesthesia Preprocedure Evaluation (Signed)
Anesthesia Evaluation  Patient identified by MRN, date of birth, ID band Patient awake    Reviewed: Allergy & Precautions, H&P , NPO status , Patient's Chart, lab work & pertinent test results, reviewed documented beta blocker date and time   Airway Mallampati: II  TM Distance: >3 FB Neck ROM: full    Dental no notable dental hx.    Pulmonary neg pulmonary ROS,    Pulmonary exam normal breath sounds clear to auscultation       Cardiovascular Exercise Tolerance: Good negative cardio ROS   Rhythm:regular Rate:Normal     Neuro/Psych negative neurological ROS  negative psych ROS   GI/Hepatic negative GI ROS, Neg liver ROS,   Endo/Other  Hypothyroidism   Renal/GU negative Renal ROS  negative genitourinary   Musculoskeletal   Abdominal   Peds  Hematology negative hematology ROS (+)   Anesthesia Other Findings   Reproductive/Obstetrics negative OB ROS                             Anesthesia Physical Anesthesia Plan  ASA: II  Anesthesia Plan: General   Post-op Pain Management:    Induction:   PONV Risk Score and Plan: 3 and TIVA, Midazolam and Propofol infusion  Airway Management Planned:   Additional Equipment:   Intra-op Plan:   Post-operative Plan:   Informed Consent: I have reviewed the patients History and Physical, chart, labs and discussed the procedure including the risks, benefits and alternatives for the proposed anesthesia with the patient or authorized representative who has indicated his/her understanding and acceptance.     Dental Advisory Given  Plan Discussed with: CRNA  Anesthesia Plan Comments:         Anesthesia Quick Evaluation

## 2020-11-02 NOTE — H&P (Signed)
Midge Minium, MD [facility] [address], [address] [place], [place] [address] Phone: [phone] Fax : [phone]  Primary Care Physician:  Eustaquio Boyden, MD Primary Gastroenterologist:  Dr. Servando Snare  Pre-Procedure History & Physical: HPI:  Lindsey Proctor is a 49 y.o. female is here for a screening colonoscopy.   Past Medical History:  Diagnosis Date  . Chronic cough 2012   thought due to post nasal drip - improved on flonase and singulair  . Concussion 04/2015   with LOC, eval at [facility] with normal CT  . COVID-19 virus infection 09/2019  . Fainting spell 2003  . History of chicken pox   . Hypothyroidism 10/31/2014  . Overweight(278.02)   . Vitamin B12 deficiency 09/08/2015  . Vitamin D deficiency 09/08/2015    Past Surgical History:  Procedure Laterality Date  . BREAST BIOPSY  2002  . RHINOPLASTY  2000   for deviated septum    Prior to Admission medications   Medication Sig Start Date End Date Taking? Authorizing Provider  Cholecalciferol (VITAMIN D3) 1.25 MG (50000 UT) TABS Take 1 tablet by mouth once a week. 12/14/19   Eustaquio Boyden, MD  diclofenac (VOLTAREN) 75 MG EC tablet Take 1 tablet by mouth twice daily 07/25/20   Eustaquio Boyden, MD  DULoxetine (CYMBALTA) 30 MG capsule Take 1 capsule (30 mg total) by mouth daily. 09/03/20   Eustaquio Boyden, MD  ESTROGENS CONJUGATED PO Take by mouth daily.    [provider]  montelukast (SINGULAIR) 10 MG tablet Take 1 tablet (10 mg total) by mouth at bedtime. 09/03/20   Eustaquio Boyden, MD  PROGESTERONE PO Take by mouth daily.    [provider]  vitamin B-12 (CYANOCOBALAMIN) 500 MCG tablet Take 1 tablet (500 mcg total) by mouth daily. 12/20/19   Eustaquio Boyden, MD    Allergies as of 09/20/2020  . (No Known Allergies)    Family History  Problem Relation Age of Onset  . Diabetes Father   . Asthma Other        great great grandmother  . Coronary artery disease Neg Hx   . Cancer Neg Hx   .  Stroke Neg Hx   . Hypertension Neg Hx     Social History   Socioeconomic History  . Marital status: Married    Spouse name: Not on file  . Number of children: Not on file  . Years of education: Not on file  . Highest education level: Not on file  Occupational History  . Occupation: customer service    Employer: [employer]  Tobacco Use  . Smoking status: Never Smoker  . Smokeless tobacco: Never Used  Substance and Sexual Activity  . Alcohol use: No    Alcohol/week: 0.0 standard drinks  . Drug use: No  . Sexual activity: Not on file  Other Topics Concern  . Not on file  Social History Narrative   Caffeine: 3 20oz caffeinated drinks/day (diet mountain dew)   Lives with husband, 2 children (son and daughter), 1 dog and cat   Occupation: customer service call center   Activity: tries to walk regularly at work   Diet: fruits and vegetables, lots of water   Social Determinants of Health   Financial Resource Strain: Not on file  Food Insecurity: Not on file  Transportation Needs: Not on file  Physical Activity: Not on file  Stress: Not on file  Social Connections: Not on file  Intimate Partner Violence: Not on file    Review of Systems:  See HPI, otherwise negative ROS  Physical Exam: BP 129/83   Pulse 64   Temp 98.1 F (36.7 C) (Temporal)   Resp 18   Ht 5\' 4"  (1.626 m)   Wt 82.1 kg   LMP 11/05/2017 Comment: last period 3 years ago  SpO2 98%   BMI 31.07 kg/m  General:   Alert,  pleasant and cooperative in NAD Head:  Normocephalic and atraumatic. Neck:  Supple; no masses or thyromegaly. Lungs:  Clear throughout to auscultation.    Heart:  Regular rate and rhythm. Abdomen:  Soft, nontender and nondistended. Normal bowel sounds, without guarding, and without rebound.   Neurologic:  Alert and  oriented x4;  grossly normal neurologically.  Impression/Plan: Lindsey Proctor is now here to undergo a screening colonoscopy.  Risks, benefits, and alternatives  regarding colonoscopy have been reviewed with the patient.  Questions have been answered.  All parties agreeable.

## 2020-11-02 NOTE — Anesthesia Postprocedure Evaluation (Signed)
Anesthesia Post Note  Patient: Lindsey Proctor  Procedure(s) Performed: COLONOSCOPY WITH PROPOFOL (N/A ) POLYPECTOMY     Patient location during evaluation: PACU Anesthesia Type: General Level of consciousness: awake and alert Pain management: pain level controlled Vital Signs Assessment: post-procedure vital signs reviewed and stable Respiratory status: spontaneous breathing, nonlabored ventilation, respiratory function stable and patient connected to nasal cannula oxygen Cardiovascular status: blood pressure returned to baseline and stable Postop Assessment: no apparent nausea or vomiting Anesthetic complications: no   No complications documented.  Rayan Dyal, Salvadore Dom

## 2020-11-02 NOTE — Anesthesia Procedure Notes (Signed)
Date/Time: 11/02/2020 9:05 AM Performed by: Maree Krabbe, CRNA Pre-anesthesia Checklist: Patient identified, Emergency Drugs available, Suction available, Timeout performed and Patient being monitored Patient Re-evaluated:Patient Re-evaluated prior to induction Oxygen Delivery Method: Nasal cannula Placement Confirmation: positive ETCO2

## 2020-11-02 NOTE — Transfer of Care (Signed)
Immediate Anesthesia Transfer of Care Note  Patient: Lindsey Proctor  Procedure(s) Performed: COLONOSCOPY WITH PROPOFOL (N/A ) POLYPECTOMY  Patient Location: PACU  Anesthesia Type: General  Level of Consciousness: awake, alert  and patient cooperative  Airway and Oxygen Therapy: Patient Spontanous Breathing and Patient connected to supplemental oxygen  Post-op Assessment: Post-op Vital signs reviewed, Patient's Cardiovascular Status Stable, Respiratory Function Stable, Patent Airway and No signs of Nausea or vomiting  Post-op Vital Signs: Reviewed and stable  Complications: No complications documented.

## 2020-11-02 NOTE — Op Note (Signed)
[facility] Gastroenterology Patient Name: Lindsey Proctor Procedure Date: 11/02/2020 8:49 AM MRN: [id number] Account #: [id number] Date of Birth: 07-Apr-1971 Admit Type: Outpatient Age: 49 Room: [facility] OR ROOM 01 Gender: Female Note Status: Finalized Procedure:             Colonoscopy Indications:           Screening for colorectal malignant neoplasm Providers:             Midge Minium MD, MD Referring MD:          Eustaquio Boyden (Referring MD) Medicines:             Propofol per Anesthesia Complications:         No immediate complications. Procedure:             Pre-Anesthesia Assessment:                        - Prior to the procedure, a History and Physical was                         performed, and patient medications and allergies were                         reviewed. The patient's tolerance of previous                         anesthesia was also reviewed. The risks and benefits                         of the procedure and the sedation options and risks                         were discussed with the patient. All questions were                         answered, and informed consent was obtained. Prior                         Anticoagulants: The patient has taken no previous                         anticoagulant or antiplatelet agents. ASA Grade                         Assessment: II - A patient with mild systemic disease.                         After reviewing the risks and benefits, the patient                         was deemed in satisfactory condition to undergo the                         procedure.                        After obtaining informed consent, the colonoscope was  passed under direct vision. Throughout the procedure,                         the patient's blood pressure, pulse, and oxygen                         saturations were monitored continuously. The                         Colonoscope was introduced through the  anus and                         advanced to the the cecum, identified by appendiceal                         orifice and ileocecal valve. The colonoscopy was                         performed without difficulty. The patient tolerated                         the procedure well. The quality of the bowel                         preparation was excellent. Findings:      The perianal and digital rectal examinations were normal.      Three sessile polyps were found in the sigmoid colon. The polyps were 2       to 3 mm in size. These polyps were removed with a cold biopsy forceps.       Resection and retrieval were complete.      Non-bleeding internal hemorrhoids were found during retroflexion. The       hemorrhoids were Grade I (internal hemorrhoids that do not prolapse).      Multiple small-mouthed diverticula were found in the sigmoid colon. Impression:            - Three 2 to 3 mm polyps in the sigmoid colon, removed                         with a cold biopsy forceps. Resected and retrieved.                        - Non-bleeding internal hemorrhoids.                        - Diverticulosis in the sigmoid colon. Recommendation:        - Discharge patient to home.                        - Resume previous diet.                        - Continue present medications.                        - Await pathology results.                        - Repeat colonoscopy in 5 years if polyp adenoma and  10 years if hyperplastic Procedure Code(s):     --- Professional ---                        [id number], Colonoscopy, flexible; with biopsy, single or                         multiple Diagnosis Code(s):     --- Professional ---                        Z12.11, Encounter for screening for malignant neoplasm                         of colon                        K63.5, Polyp of colon CPT copyright 2019 American Medical Association. All rights reserved. The codes documented in this report  are preliminary and upon coder review may  be revised to meet current compliance requirements. Midge Minium MD, MD 11/02/2020 9:11:29 AM This report has been signed electronically. Number of Addenda: 0 Note Initiated On: 11/02/2020 8:49 AM Scope Withdrawal Time: 0 hours 7 minutes 55 seconds  Total Procedure Duration: 0 hours 11 minutes 13 seconds  Estimated Blood Loss:  Estimated blood loss: none.      [facility]

## 2020-11-05 ENCOUNTER — Encounter: Payer: Self-pay | Admitting: Gastroenterology

## 2020-11-06 LAB — SURGICAL PATHOLOGY

## 2020-11-10 ENCOUNTER — Encounter: Payer: Self-pay | Admitting: Family Medicine

## 2020-11-12 ENCOUNTER — Encounter: Payer: Self-pay | Admitting: Gastroenterology

## 2020-11-27 ENCOUNTER — Telehealth: Payer: Self-pay | Admitting: Family Medicine

## 2020-11-27 NOTE — Telephone Encounter (Signed)
Received preop request for planned L hip replacement 01/15/2021 by Dr Lequita Halt. Please call to schedule OV

## 2020-11-29 ENCOUNTER — Telehealth: Payer: Self-pay | Admitting: Family Medicine

## 2020-11-29 NOTE — Telephone Encounter (Signed)
Left vm for the patient to call the office to schedule appt. EM

## 2020-11-29 NOTE — Telephone Encounter (Signed)
Patient was requested to have a preop appt,. Patient is scheduled for cpe on the 2/23 can she have her pre op appt the same day due to her living so far away?

## 2020-11-30 NOTE — Telephone Encounter (Signed)
Ok to do

## 2021-01-09 ENCOUNTER — Encounter: Payer: Self-pay | Admitting: Family Medicine

## 2021-01-09 ENCOUNTER — Other Ambulatory Visit: Payer: Self-pay

## 2021-01-09 ENCOUNTER — Ambulatory Visit (INDEPENDENT_AMBULATORY_CARE_PROVIDER_SITE_OTHER): Payer: Commercial Managed Care - PPO | Admitting: Family Medicine

## 2021-01-09 ENCOUNTER — Ambulatory Visit (INDEPENDENT_AMBULATORY_CARE_PROVIDER_SITE_OTHER)
Admission: RE | Admit: 2021-01-09 | Discharge: 2021-01-09 | Disposition: A | Discharge status: Home / Self Care | Payer: Commercial Managed Care - PPO | Source: Ambulatory Visit | Attending: Family Medicine | Admitting: Family Medicine

## 2021-01-09 VITALS — BP 118/80 | HR 71 | Temp 98.2°F | Ht 64.25 in | Wt 189.6 lb

## 2021-01-09 DIAGNOSIS — E538 Deficiency of other specified B group vitamins: Secondary | ICD-10-CM | POA: Diagnosis not present

## 2021-01-09 DIAGNOSIS — Z0000 Encounter for general adult medical examination without abnormal findings: Secondary | ICD-10-CM | POA: Diagnosis not present

## 2021-01-09 DIAGNOSIS — E039 Hypothyroidism, unspecified: Secondary | ICD-10-CM | POA: Diagnosis not present

## 2021-01-09 DIAGNOSIS — E559 Vitamin D deficiency, unspecified: Secondary | ICD-10-CM

## 2021-01-09 DIAGNOSIS — E669 Obesity, unspecified: Secondary | ICD-10-CM

## 2021-01-09 DIAGNOSIS — M1612 Unilateral primary osteoarthritis, left hip: Secondary | ICD-10-CM

## 2021-01-09 DIAGNOSIS — F418 Other specified anxiety disorders: Secondary | ICD-10-CM

## 2021-01-09 DIAGNOSIS — Z0001 Encounter for general adult medical examination with abnormal findings: Secondary | ICD-10-CM

## 2021-01-09 DIAGNOSIS — E785 Hyperlipidemia, unspecified: Secondary | ICD-10-CM

## 2021-01-09 DIAGNOSIS — Z01818 Encounter for other preprocedural examination: Secondary | ICD-10-CM | POA: Insufficient documentation

## 2021-01-09 LAB — COMPREHENSIVE METABOLIC PANEL
ALT: 18 U/L (ref 0–35)
AST: 16 U/L (ref 0–37)
Albumin: 4.6 g/dL (ref 3.5–5.2)
Alkaline Phosphatase: 74 U/L (ref 39–117)
BUN: 13 mg/dL (ref 6–23)
CO2: 31 mEq/L (ref 19–32)
Calcium: 9.6 mg/dL (ref 8.4–10.5)
Chloride: 103 mEq/L (ref 96–112)
Creatinine, Ser: 0.63 mg/dL (ref 0.40–1.20)
GFR: 103.46 mL/min (ref >60.00)
Glucose, Bld: 86 mg/dL (ref 70–99)
Potassium: 3.8 mEq/L (ref 3.5–5.1)
Sodium: 141 mEq/L (ref 135–145)
Total Bilirubin: 0.4 mg/dL (ref 0.2–1.2)
Total Protein: 7.6 g/dL (ref 6.0–8.3)

## 2021-01-09 LAB — CBC WITH DIFFERENTIAL/PLATELET
Basophils Absolute: 0 10*3/uL (ref 0.0–0.1)
Basophils Relative: 0.4 % (ref 0.0–3.0)
Eosinophils Absolute: 0 10*3/uL (ref 0.0–0.7)
Eosinophils Relative: 0.6 % (ref 0.0–5.0)
HCT: 39.2 % (ref 36.0–46.0)
Hemoglobin: 13.1 g/dL (ref 12.0–15.0)
Lymphocytes Relative: 27.6 % (ref 12.0–46.0)
Lymphs Abs: 2.1 10*3/uL (ref 0.7–4.0)
MCHC: 33.5 g/dL (ref 30.0–36.0)
MCV: 89.1 fl (ref 78.0–100.0)
Monocytes Absolute: 0.5 10*3/uL (ref 0.1–1.0)
Monocytes Relative: 7.1 % (ref 3.0–12.0)
Neutro Abs: 4.8 10*3/uL (ref 1.4–7.7)
Neutrophils Relative %: 64.3 % (ref 43.0–77.0)
Platelets: 220 10*3/uL (ref 150.0–400.0)
RBC: 4.4 Mil/uL (ref 3.87–5.11)
RDW: 14 % (ref 11.5–15.5)
WBC: 7.4 10*3/uL (ref 4.0–10.5)

## 2021-01-09 LAB — LIPID PANEL
Cholesterol: 249 mg/dL — ABNORMAL HIGH (ref 0–200)
HDL: 55.9 mg/dL (ref >39.00)
LDL Cholesterol: 169 mg/dL — ABNORMAL HIGH (ref 0–99)
NonHDL: 193.03
Total CHOL/HDL Ratio: 4
Triglycerides: 121 mg/dL (ref 0.0–149.0)
VLDL: 24.2 mg/dL (ref 0.0–40.0)

## 2021-01-09 LAB — VITAMIN D 25 HYDROXY (VIT D DEFICIENCY, FRACTURES): VITD: 31.74 ng/mL (ref 30.00–100.00)

## 2021-01-09 LAB — T4, FREE: Free T4: 0.68 ng/dL (ref 0.60–1.60)

## 2021-01-09 LAB — PROTIME-INR
INR: 0.9 ratio (ref 0.8–1.0)
Prothrombin Time: 10.1 s (ref 9.6–13.1)

## 2021-01-09 LAB — TSH: TSH: 1.99 u[IU]/mL (ref 0.35–4.50)

## 2021-01-09 LAB — VITAMIN B12: Vitamin B-12: 424 pg/mL (ref 211–911)

## 2021-01-09 NOTE — Assessment & Plan Note (Signed)
RCRI = 0.  Check EKG, CXR, labs.  Will forwad results to Dr Lequita Halt.  Anticipate adequately low risk to proceed with planned surgery.

## 2021-01-09 NOTE — Patient Instructions (Addendum)
Labs today  EKG and chest xray today  We will forward results to Dr Aluisio's office.  I hope you have a speedy recovery after surgery! Consider shingrix vaccine in the future.  Return as needed or in 1 year for next physical.   Health Maintenance for Postmenopausal Women Menopause is a normal process in which your ability to get pregnant comes to an end. This process happens slowly over many months or years, usually between the ages of 64 and 64. Menopause is complete when you have missed your menstrual periods for 12 months. It is important to talk with your health care provider about some of the most common conditions that affect women after menopause (postmenopausal women). These include heart disease, cancer, and bone loss (osteoporosis). Adopting a healthy lifestyle and getting preventive care can help to promote your health and wellness. The actions you take can also lower your chances of developing some of these common conditions. What should I know about menopause? During menopause, you may get a number of symptoms, such as:  Hot flashes. These can be moderate or severe.  Night sweats.  Decrease in sex drive.  Mood swings.  Headaches.  Tiredness.  Irritability.  Memory problems.  Insomnia. Choosing to treat or not to treat these symptoms is a decision that you make with your health care provider. Do I need hormone replacement therapy?  Hormone replacement therapy is effective in treating symptoms that are caused by menopause, such as hot flashes and night sweats.  Hormone replacement carries certain risks, especially as you become older. If you are thinking about using estrogen or estrogen with progestin, discuss the benefits and risks with your health care provider. What is my risk for heart disease and stroke? The risk of heart disease, heart attack, and stroke increases as you age. One of the causes may be a change in the body's hormones during menopause. This can  affect how your body uses dietary fats, triglycerides, and cholesterol. Heart attack and stroke are medical emergencies. There are many things that you can do to help prevent heart disease and stroke. Watch your blood pressure  High blood pressure causes heart disease and increases the risk of stroke. This is more likely to develop in people who have high blood pressure readings, are of African descent, or are overweight.  Have your blood pressure checked: ? Every 3-5 years if you are 31-61 years of age. ? Every year if you are 52 years old or older. Eat a healthy diet  Eat a diet that includes plenty of vegetables, fruits, low-fat dairy products, and lean protein.  Do not eat a lot of foods that are high in solid fats, added sugars, or sodium.   Get regular exercise Get regular exercise. This is one of the most important things you can do for your health. Most adults should:  Try to exercise for at least 150 minutes each week. The exercise should increase your heart rate and make you sweat (moderate-intensity exercise).  Try to do strengthening exercises at least twice each week. Do these in addition to the moderate-intensity exercise.  Spend less time sitting. Even light physical activity can be beneficial. Other tips  Work with your health care provider to achieve or maintain a healthy weight.  Do not use any products that contain nicotine or tobacco, such as cigarettes, e-cigarettes, and chewing tobacco. If you need help quitting, ask your health care provider.  Know your numbers. Ask your health care provider to check your  cholesterol and your blood sugar (glucose). Continue to have your blood tested as directed by your health care provider. Do I need screening for cancer? Depending on your health history and family history, you may need to have cancer screening at different stages of your life. This may include screening for:  Breast cancer.  Cervical cancer.  Lung  cancer.  Colorectal cancer. What is my risk for osteoporosis? After menopause, you may be at increased risk for osteoporosis. Osteoporosis is a condition in which bone destruction happens more quickly than new bone creation. To help prevent osteoporosis or the bone fractures that can happen because of osteoporosis, you may take the following actions:  If you are 64-31 years old, get at least 1,000 mg of calcium and at least 600 mg of vitamin D per day.  If you are older than age 28 but younger than age 58, get at least 1,200 mg of calcium and at least 600 mg of vitamin D per day.  If you are older than age 31, get at least 1,200 mg of calcium and at least 800 mg of vitamin D per day. Smoking and drinking excessive alcohol increase the risk of osteoporosis. Eat foods that are rich in calcium and vitamin D, and do weight-bearing exercises several times each week as directed by your health care provider. How does menopause affect my mental health? Depression may occur at any age, but it is more common as you become older. Common symptoms of depression include:  Low or sad mood.  Changes in sleep patterns.  Changes in appetite or eating patterns.  Feeling an overall lack of motivation or enjoyment of activities that you previously enjoyed.  Frequent crying spells. Talk with your health care provider if you think that you are experiencing depression. General instructions See your health care provider for regular wellness exams and vaccines. This may include:  Scheduling regular health, dental, and eye exams.  Getting and maintaining your vaccines. These include: ? Influenza vaccine. Get this vaccine each year before the flu season begins. ? Pneumonia vaccine. ? Shingles vaccine. ? Tetanus, diphtheria, and pertussis (Tdap) booster vaccine. Your health care provider may also recommend other immunizations. Tell your health care provider if you have ever been abused or do not feel safe at  home. Summary  Menopause is a normal process in which your ability to get pregnant comes to an end.  This condition causes hot flashes, night sweats, decreased interest in sex, mood swings, headaches, or lack of sleep.  Treatment for this condition may include hormone replacement therapy.  Take actions to keep yourself healthy, including exercising regularly, eating a healthy diet, watching your weight, and checking your blood pressure and blood sugar levels.  Get screened for cancer and depression. Make sure that you are up to date with all your vaccines. This information is not intended to replace advice given to you by your health care provider. Make sure you discuss any questions you have with your health care provider. Document Revised: 10/27/2018 Document Reviewed: 10/27/2018 Elsevier Patient Education  2021 [employer].

## 2021-01-09 NOTE — Assessment & Plan Note (Signed)
Planned L hip replacement surgery next week by Dr Lequita Halt.

## 2021-01-09 NOTE — Assessment & Plan Note (Signed)
Ran out of weekly replacement 1 wk ago - will update levels.

## 2021-01-09 NOTE — Assessment & Plan Note (Signed)
7 lb weight loss since last seen here. Activity currently limited by hip pain. Motivated for renewed efforts at weight loss after upcoming hip replacement surgery.

## 2021-01-09 NOTE — Progress Notes (Addendum)
Patient ID: Lindsey Proctor, female    DOB: 07-Nov-1971, 50 y.o.   MRN: [id number]  This visit was conducted in person.  BP 118/80   Pulse 71   Temp 98.2 F (36.8 C) (Temporal)   Ht 5' 4.25" (1.632 m)   Wt 189 lb 9 oz (86 kg)   SpO2 98%   BMI 32.29 kg/m    CC: CPE, preop visit Subjective:   HPI: Lindsey Proctor is a 50 y.o. female presenting on 01/09/2021 for Annual Exam   Chronic L hip pain pending hip replacement through [facility] in [place] - saw Dr Lequita Halt. Scheduled 01/16/2021. Activity limited due to hip pain. Preop eval and labwork to be completed today. She has tolerated general anesthesia in the past. No h/o postop nausea/vomiting or difficulty awakening from anesthesia.   Depression/anxiety - stable period on cymbalta 30mg  daily - unsure if this has significantly helped.   Vit D  - ran out 1 wk ago.   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 May be interested to see ENT, will let me know.   Preventative: COLONOSCOPY WITH PROPOFOL 11/02/2020 - HP, rpt 10 yrs 11/04/2020, Darren, MD) [facility] with Dr Nestor Ramp, Q2 yrs. Upcoming appt 01/2021.  LMP -08/2015. Off and on HRT through GYN.  Mammogram - yearly, last 12/2019 - needed dx mammo/US which returned normal.  Lung cancer screen - not eligible  Flu shotyearly - declines today COVID vaccine - discussed, declines at this time Tdap 2012 Shingrix - discussed  Seat belt use discussed. Sunscreen use discussed. No changing moles on skin.  Non smoker - mother smokes outside Alcohol - none Dentist q6 mo  Eye exam yearly   Caffeine: 2 20oz caffeinated drinks/day (diet mountain dew) Lives with husband, 2 children (son and daughter), 1 dog and cat Occupation: customer service call center Activity:limited due to hip pain  Diet: fruits and vegetables daily, lots of water, red meat 4 times a week, fish  rarely     Relevant past medical, surgical, family and social history reviewed and updated as indicated. Interim medical history since our last visit reviewed. Allergies and medications reviewed and updated. Outpatient Medications Prior to Visit  Medication Sig Dispense Refill  . diclofenac (VOLTAREN) 75 MG EC tablet Take 1 tablet by mouth twice daily 30 tablet 0  . DULoxetine (CYMBALTA) 30 MG capsule Take 1 capsule (30 mg total) by mouth daily. 30 capsule 6  . montelukast (SINGULAIR) 10 MG tablet Take 1 tablet (10 mg total) by mouth at bedtime. 30 tablet 11  . Cholecalciferol (VITAMIN D3) 1.25 MG (50000 UT) TABS Take 1 tablet by mouth once a week. 12 tablet 3  . ESTROGENS CONJUGATED PO Take by mouth daily.    2013 PROGESTERONE PO Take by mouth daily.    . vitamin B-12 (CYANOCOBALAMIN) 500 MCG tablet Take 1 tablet (500 mcg total) by mouth daily.     No facility-administered medications prior to visit.     Per HPI unless specifically indicated in ROS section below Review of Systems  Constitutional: Negative for activity change, appetite change, chills, fatigue, fever and unexpected weight change.  HENT: Negative for hearing loss.   Eyes: Negative for visual disturbance.  Respiratory: Negative for cough, chest tightness, shortness of breath and wheezing.   Cardiovascular: Negative for chest pain, palpitations and leg swelling.  Gastrointestinal: Negative for abdominal distention, abdominal pain, blood  in stool, constipation, diarrhea, nausea and vomiting.  Genitourinary: Negative for difficulty urinating and hematuria.  Musculoskeletal: Negative for arthralgias, myalgias and neck pain.  Skin: Negative for rash.  Neurological: Negative for dizziness, seizures, syncope and headaches.  Hematological: Negative for adenopathy. Does not bruise/bleed easily.  Psychiatric/Behavioral: Positive for dysphoric mood. The patient is nervous/anxious.        ?pain related   Objective:  BP 118/80    Pulse 71   Temp 98.2 F (36.8 C) (Temporal)   Ht 5' 4.25" (1.632 m)   Wt 189 lb 9 oz (86 kg)   SpO2 98%   BMI 32.29 kg/m   Wt Readings from Last 3 Encounters:  01/09/21 189 lb 9 oz (86 kg)  11/02/20 181 lb (82.1 kg)  09/03/20 196 lb 3.2 oz (89 kg)      Physical Exam Vitals and nursing note reviewed.  Constitutional:      General: She is not in acute distress.    Appearance: Normal appearance. She is well-developed and well-nourished. She is not ill-appearing.  HENT:     Head: Normocephalic and atraumatic.     Right Ear: Hearing, tympanic membrane, ear canal and external ear normal.     Left Ear: Hearing, tympanic membrane, ear canal and external ear normal.     Mouth/Throat:     Mouth: Oropharynx is clear and moist and mucous membranes are normal.     Pharynx: No posterior oropharyngeal edema.  Eyes:     General: No scleral icterus.    Extraocular Movements: Extraocular movements intact and EOM normal.     Conjunctiva/sclera: Conjunctivae normal.     Pupils: Pupils are equal, round, and reactive to light.  Neck:     Thyroid: No thyroid mass or thyromegaly.  Cardiovascular:     Rate and Rhythm: Normal rate and regular rhythm.     Pulses: Normal pulses and intact distal pulses.          Radial pulses are 2+ on the right side and 2+ on the left side.     Heart sounds: Normal heart sounds. No murmur heard.   Pulmonary:     Effort: Pulmonary effort is normal. No respiratory distress.     Breath sounds: Normal breath sounds. No wheezing, rhonchi or rales.  Abdominal:     General: Abdomen is flat. Bowel sounds are normal. There is no distension.     Palpations: Abdomen is soft. There is no mass.     Tenderness: There is no abdominal tenderness. There is no guarding or rebound.     Hernia: No hernia is present.  Musculoskeletal:        General: No edema. Normal range of motion.     Cervical back: Normal range of motion and neck supple.     Right lower leg: No edema.      Left lower leg: No edema.  Lymphadenopathy:     Cervical: No cervical adenopathy.  Skin:    General: Skin is warm and dry.     Findings: No rash.  Neurological:     General: No focal deficit present.     Mental Status: She is alert and oriented to person, place, and time.     Comments: CN grossly intact, station and gait intact  Psychiatric:        Mood and Affect: Mood and affect and mood normal.        Behavior: Behavior normal.        Thought Content: Thought  content normal.        Judgment: Judgment normal.       Results for orders placed or performed in visit on 01/09/21  Lipid panel  Result Value Ref Range   Cholesterol 249 (H) 0 - 200 mg/dL   Triglycerides 409.8 0.0 - 149.0 mg/dL   HDL 11.91 >47.82 mg/dL   VLDL 95.6 0.0 - 21.3 mg/dL   LDL Cholesterol 086 (H) 0 - 99 mg/dL   Total CHOL/HDL Ratio 4    NonHDL 193.03   Comprehensive metabolic panel  Result Value Ref Range   Sodium 141 135 - 145 mEq/L   Potassium 3.8 3.5 - 5.1 mEq/L   Chloride 103 96 - 112 mEq/L   CO2 31 19 - 32 mEq/L   Glucose, Bld 86 70 - 99 mg/dL   BUN 13 6 - 23 mg/dL   Creatinine, Ser 5.78 0.40 - 1.20 mg/dL   Total Bilirubin 0.4 0.2 - 1.2 mg/dL   Alkaline Phosphatase 74 39 - 117 U/L   AST 16 0 - 37 U/L   ALT 18 0 - 35 U/L   Total Protein 7.6 6.0 - 8.3 g/dL   Albumin 4.6 3.5 - 5.2 g/dL   GFR 469.62 >95.28 mL/min   Calcium 9.6 8.4 - 10.5 mg/dL  CBC with Differential/Platelet  Result Value Ref Range   WBC 7.4 4.0 - 10.5 K/uL   RBC 4.40 3.87 - 5.11 Mil/uL   Hemoglobin 13.1 12.0 - 15.0 g/dL   HCT 41.3 24.4 - 01.0 %   MCV 89.1 78.0 - 100.0 fl   MCHC 33.5 30.0 - 36.0 g/dL   RDW 27.2 53.6 - 64.4 %   Platelets 220.0 150.0 - 400.0 K/uL   Neutrophils Relative % 64.3 43.0 - 77.0 %   Lymphocytes Relative 27.6 12.0 - 46.0 %   Monocytes Relative 7.1 3.0 - 12.0 %   Eosinophils Relative 0.6 0.0 - 5.0 %   Basophils Relative 0.4 0.0 - 3.0 %   Neutro Abs 4.8 1.4 - 7.7 K/uL   Lymphs Abs 2.1 0.7 - 4.0 K/uL    Monocytes Absolute 0.5 0.1 - 1.0 K/uL   Eosinophils Absolute 0.0 0.0 - 0.7 K/uL   Basophils Absolute 0.0 0.0 - 0.1 K/uL  Protime-INR  Result Value Ref Range   INR 0.9 0.8 - 1.0 ratio   Prothrombin Time 10.1 9.6 - 13.1 sec  Vitamin B12  Result Value Ref Range   Vitamin B-12 424 211 - 911 pg/mL  VITAMIN D 25 Hydroxy (Vit-D Deficiency, Fractures)  Result Value Ref Range   VITD 31.74 30.00 - 100.00 ng/mL  TSH  Result Value Ref Range   TSH 1.99 0.35 - 4.50 uIU/mL  T4, free  Result Value Ref Range   Free T4 0.68 0.60 - 1.60 ng/dL    Depression screen [facility] 2/9 01/09/2021 09/03/2020 09/03/2020 12/14/2019 09/28/2018  Decreased Interest 0 1 0 0 0  Down, Depressed, Hopeless 0 1 0 0 0  PHQ - 2 Score 0 2 0 0 0  Altered sleeping 2 1 - - -  Tired, decreased energy 1 3 - - -  Change in appetite 1 1 - - -  Feeling bad or failure about yourself  1 2 - - -  Trouble concentrating 0 2 - - -  Moving slowly or fidgety/restless 0 1 - - -  Suicidal thoughts 0 0 - - -  PHQ-9 Score 5 12 - - -    GAD 7 : Generalized Anxiety  Score 01/09/2021 09/03/2020  Nervous, Anxious, on Edge 1 2  Control/stop worrying 1 2  Worry too much - different things 1 2  Trouble relaxing 1 2  Restless 1 0  Easily annoyed or irritable 2 3  Afraid - awful might happen 1 0  Total GAD 7 Score 8 11   EKG - NSR rate 60, normal axis, intervals, no hypertrophy, no acute ST/T changes Assessment & Plan:  This visit occurred during the SARS-CoV-2 public health emergency.  Safety protocols were in place, including screening questions prior to the visit, additional usage of staff PPE, and extensive cleaning of exam room while observing appropriate contact time as indicated for disinfecting solutions.   Problem List Items Addressed This Visit    Vitamin D deficiency    Ran out of weekly replacement 1 wk ago - will update levels.       Relevant Orders   VITAMIN D 25 Hydroxy (Vit-D Deficiency, Fractures)   Vitamin B12 deficiency     Update levels off replacement.      Relevant Orders   Vitamin B12   Primary osteoarthritis of left hip    Planned L hip replacement surgery next week by Dr Lequita Halt.       Pre-op evaluation    RCRI = 0.  Check EKG, CXR, labs.  Will forwad results to Dr Lequita Halt.  Anticipate adequately low risk to proceed with planned surgery.       Relevant Orders   Comprehensive metabolic panel   CBC with Differential/Platelet   Protime-INR   DG Chest 2 View   EKG 12-Lead (Completed)   Obesity, Class I, BMI 30-34.9    7 lb weight loss since last seen here. Activity currently limited by hip pain. Motivated for renewed efforts at weight loss after upcoming hip replacement surgery.       Mild hyperlipidemia    Update labs off medication. The 10-year ASCVD risk score Denman George DC Montez Hageman., et al., 2013) is: 1.4%   Values used to calculate the score:     Age: 12 years     Sex: Female     Is Non-Hispanic African American: No     Diabetic: No     Tobacco smoker: No     Systolic Blood Pressure: 118 mmHg     Is BP treated: No     HDL Cholesterol: 45.5 mg/dL     Total Cholesterol: 210 mg/dL       Relevant Orders   Lipid panel   Comprehensive metabolic panel   Hypothyroidism    H/o this. Recently normal readings off replacement - update levels today.       Relevant Orders   TSH   T4, free   Encounter for general adult medical examination with abnormal findings - Primary    Preventative protocols reviewed and updated unless pt declined. Discussed healthy diet and lifestyle.  She would be interested in shingrix vaccine as possibly had shingles 03/2020. Discussed expected immunity for 1 yr after shingles.       Depression with anxiety    Possible situational mood disorder due to chronic pain.  Overall stable period on cymbalta 30mg  - but unsure how much it's truly helping.  May be interested in trial taper off medication after hip replacement.           No orders of the defined types were  placed in this encounter.  Orders Placed This Encounter  Procedures  . DG Chest 2 View  Standing Status:   Future    Number of Occurrences:   1    Standing Expiration Date:   01/09/2022    Order Specific Question:   Reason for Exam (SYMPTOM  OR DIAGNOSIS REQUIRED)    Answer:   preop eval    Order Specific Question:   Is patient pregnant?    Answer:   No    Order Specific Question:   Preferred imaging location?    Answer:   [facility]  . Lipid panel  . Comprehensive metabolic panel  . CBC with Differential/Platelet  . Protime-INR  . Vitamin B12  . VITAMIN D 25 Hydroxy (Vit-D Deficiency, Fractures)  . TSH  . T4, free  . EKG 12-Lead    Patient instructions: Labs today  EKG and chest xray today  We will forward results to Dr Aluisio's office.  I hope you have a speedy recovery after surgery! Consider shingrix vaccine in the future.  Return as needed or in 1 year for next physical.   Follow up plan: Return in about 1 year (around 01/09/2022) for annual exam, prior fasting for blood work.  Eustaquio Boyden, MD

## 2021-01-09 NOTE — Assessment & Plan Note (Addendum)
Update levels off replacement. 

## 2021-01-09 NOTE — Assessment & Plan Note (Signed)
H/o this. Recently normal readings off replacement - update levels today.

## 2021-01-09 NOTE — Assessment & Plan Note (Addendum)
Preventative protocols reviewed and updated unless pt declined. Discussed healthy diet and lifestyle.  She would be interested in shingrix vaccine as possibly had shingles 03/2020. Discussed expected immunity for 1 yr after shingles.

## 2021-01-09 NOTE — Assessment & Plan Note (Addendum)
Update labs off medication. The 10-year ASCVD risk score Denman George DC Montez Hageman., et al., 2013) is: 1.4%   Values used to calculate the score:     Age: 50 years     Sex: Female     Is Non-Hispanic African American: No     Diabetic: No     Tobacco smoker: No     Systolic Blood Pressure: 118 mmHg     Is BP treated: No     HDL Cholesterol: 45.5 mg/dL     Total Cholesterol: 210 mg/dL

## 2021-01-09 NOTE — Assessment & Plan Note (Addendum)
Possible situational mood disorder due to chronic pain.  Overall stable period on cymbalta 30mg  - but unsure how much it's truly helping.  May be interested in trial taper off medication after hip replacement.

## 2021-01-11 ENCOUNTER — Ambulatory Visit: Payer: Commercial Managed Care - PPO | Admitting: Family Medicine

## 2021-01-11 ENCOUNTER — Telehealth: Payer: Self-pay | Admitting: Family Medicine

## 2021-01-11 ENCOUNTER — Other Ambulatory Visit: Payer: Self-pay | Admitting: Family Medicine

## 2021-01-11 MED ORDER — VITAMIN D3 25 MCG (1000 UT) PO CAPS: 1.0000 | ORAL_CAPSULE | Freq: Every day | ORAL | Status: DC

## 2021-01-11 NOTE — Telephone Encounter (Signed)
Faxed pt's info earlier today.  I will follow up with [facility] Monday to confirm they received everything.

## 2021-01-11 NOTE — Telephone Encounter (Signed)
[facility] called in stating they are still needing labs for patient. Stated they are needing asap as patient is having surgery Tuesday. Please advise as they are needing CBC, CMP, and PT INR. Stat fax is [phone]

## 2021-01-14 NOTE — Telephone Encounter (Signed)
Spoke with Norwood Levo of [facility] asking if pt's faxed surgical clearance info was received.  She confirms it was received.

## 2021-01-18 ENCOUNTER — Other Ambulatory Visit: Payer: Commercial Managed Care - PPO

## 2021-01-25 ENCOUNTER — Encounter: Payer: Commercial Managed Care - PPO | Admitting: Family Medicine

## 2021-01-29 ENCOUNTER — Other Ambulatory Visit: Payer: Commercial Managed Care - PPO

## 2021-02-05 ENCOUNTER — Encounter: Payer: Commercial Managed Care - PPO | Admitting: Family Medicine

## 2021-06-11 ENCOUNTER — Encounter: Payer: Self-pay | Admitting: Physician Assistant

## 2021-06-11 ENCOUNTER — Telehealth: Payer: Commercial Managed Care - PPO | Admitting: Physician Assistant

## 2021-06-11 DIAGNOSIS — J019 Acute sinusitis, unspecified: Secondary | ICD-10-CM

## 2021-06-11 DIAGNOSIS — B9689 Other specified bacterial agents as the cause of diseases classified elsewhere: Secondary | ICD-10-CM

## 2021-06-11 MED ORDER — AMOXICILLIN-POT CLAVULANATE 875-125 MG PO TABS: 1.0000 | ORAL_TABLET | Freq: Two times a day (BID) | ORAL | 0 refills | Status: DC

## 2021-06-11 NOTE — Patient Instructions (Signed)
Lindsey Proctor, thank you for joining Margaretann Loveless, PA-C for today's virtual visit.  While this provider is not your primary care provider (PCP), if your PCP is located in our provider database this encounter information will be shared with them immediately following your visit.  Consent: (Patient) Lindsey Proctor provided verbal consent for this virtual visit at the beginning of the encounter.  Current Medications:  Current Outpatient Medications:    amoxicillin-clavulanate (AUGMENTIN) 875-125 MG tablet, Take 1 tablet by mouth 2 (two) times daily., Disp: 20 tablet, Rfl: 0   Cholecalciferol (VITAMIN D3) 25 MCG (1000 UT) CAPS, Take 1 capsule (1,000 Units total) by mouth daily., Disp: 30 capsule, Rfl:    diclofenac (VOLTAREN) 75 MG EC tablet, Take 1 tablet by mouth twice daily, Disp: 30 tablet, Rfl: 0   DULoxetine (CYMBALTA) 30 MG capsule, Take 1 capsule (30 mg total) by mouth daily., Disp: 30 capsule, Rfl: 6   montelukast (SINGULAIR) 10 MG tablet, Take 1 tablet (10 mg total) by mouth at bedtime., Disp: 30 tablet, Rfl: 11   Medications ordered in this encounter:  Meds ordered this encounter  Medications   amoxicillin-clavulanate (AUGMENTIN) 875-125 MG tablet    Sig: Take 1 tablet by mouth 2 (two) times daily.    Dispense:  20 tablet    Refill:  0    Order Specific Question:   Supervising Provider    Answer:   Hyacinth Meeker, BRIAN [[id number]]     *If you need refills on other medications prior to your next appointment, please contact your pharmacy*  Follow-Up: Call back or seek an in-person evaluation if the symptoms worsen or if the condition fails to improve as anticipated.  Other Instructions See below   If you have been instructed to have an in-person evaluation today at a local Urgent Care facility, please use the link below. It will take you to a list of all of our available [facility], including address, phone number and hours of operation. Please do not  delay care.  [facility]  If you or a family member do not have a primary care provider, use the link below to schedule a visit and establish care. When you choose a [facility] primary care physician or advanced practice provider, you gain a long-term partner in health. Find a Primary Care Provider  Learn more about [facility]'s in-office and virtual care options: [facility] - Get Care Now  Sinusitis, Adult Sinusitis is soreness and swelling (inflammation) of your sinuses. Sinuses are hollow spaces in the bones around your face. They are located: Around your eyes. In the middle of your forehead. Behind your nose. In your cheekbones. Your sinuses and nasal passages are lined with a fluid called mucus. Mucus drains out of your sinuses. Swelling can trap mucus in your sinuses. This lets germs (bacteria, virus, or fungus) grow, which leads to infection. Most of the time, this condition is caused bya virus. What are the causes? This condition is caused by: Allergies. Asthma. Germs. Things that block your nose or sinuses. Growths in the nose (nasal polyps). Chemicals or irritants in the air. Fungus (rare). What increases the risk? You are more likely to develop this condition if: You have a weak body defense system (immune system). You do a lot of swimming or diving. You use nasal sprays too much. You smoke. What are the signs or symptoms? The main symptoms of this condition are pain and a feeling of pressure around the  sinuses. Other symptoms include: Stuffy nose (congestion). Runny nose (drainage). Swelling and warmth in the sinuses. Headache. Toothache. A cough that may get worse at night. Mucus that collects in the throat or the back of the nose (postnasal drip). Being unable to smell and taste. Being very tired (fatigue). A fever. Sore throat. Bad breath. How is this diagnosed? This condition is diagnosed based on: Your symptoms. Your medical history. A  physical exam. Tests to find out if your condition is short-term (acute) or long-term (chronic). Your doctor may: Check your nose for growths (polyps). Check your sinuses using a tool that has a light (endoscope). Check for allergies or germs. Do imaging tests, such as an MRI or CT scan. How is this treated? Treatment for this condition depends on the cause and whether it is short-term or long-term. If caused by a virus, your symptoms should go away on their own within 10 days. You may be given medicines to relieve symptoms. They include: Medicines that shrink swollen tissue in the nose. Medicines that treat allergies (antihistamines). A spray that treats swelling of the nostrils.  Rinses that help get rid of thick mucus in your nose (nasal saline washes). If caused by bacteria, your doctor may wait to see if you will get better without treatment. You may be given antibiotic medicine if you have: A very bad infection. A weak body defense system. If caused by growths in the nose, you may need to have surgery. Follow these instructions at home: Medicines Take, use, or apply over-the-counter and prescription medicines only as told by your doctor. These may include nasal sprays. If you were prescribed an antibiotic medicine, take it as told by your doctor. Do not stop taking the antibiotic even if you start to feel better. Hydrate and humidify  Drink enough water to keep your pee (urine) pale yellow. Use a cool mist humidifier to keep the humidity level in your home above 50%. Breathe in steam for 10-15 minutes, 3-4 times a day, or as told by your doctor. You can do this in the bathroom while a hot shower is running. Try not to spend time in cool or dry air.  Rest Rest as much as you can. Sleep with your head raised (elevated). Make sure you get enough sleep each night. General instructions  Put a warm, moist washcloth on your face 3-4 times a day, or as often as told by your doctor.  This will help with discomfort. Wash your hands often with soap and water. If there is no soap and water, use hand sanitizer. Do not smoke. Avoid being around people who are smoking (secondhand smoke). Keep all follow-up visits as told by your doctor. This is important.  Contact a doctor if: You have a fever. Your symptoms get worse. Your symptoms do not get better within 10 days. Get help right away if: You have a very bad headache. You cannot stop throwing up (vomiting). You have very bad pain or swelling around your face or eyes. You have trouble seeing. You feel confused. Your neck is stiff. You have trouble breathing. Summary Sinusitis is swelling of your sinuses. Sinuses are hollow spaces in the bones around your face. This condition is caused by tissues in your nose that become inflamed or swollen. This traps germs. These can lead to infection. If you were prescribed an antibiotic medicine, take it as told by your doctor. Do not stop taking it even if you start to feel better. Keep all follow-up visits  as told by your doctor. This is important. This information is not intended to replace advice given to you by your health care provider. Make sure you discuss any questions you have with your healthcare provider. Document Revised: 04/05/2018 Document Reviewed: 04/05/2018 Elsevier Patient Education  2022 [employer].

## 2021-06-11 NOTE — Progress Notes (Signed)
Virtual Visit Consent   Lindsey Proctor, you are scheduled for a virtual visit with a [facility] provider today.     Just as with appointments in the office, your consent must be obtained to participate.  Your consent will be active for this visit and any virtual visit you may have with one of our providers in the next 365 days.     If you have a MyChart account, a copy of this consent can be sent to you electronically.  All virtual visits are billed to your insurance company just like a traditional visit in the office.    As this is a virtual visit, video technology does not allow for your provider to perform a traditional examination.  This may limit your provider's ability to fully assess your condition.  If your provider identifies any concerns that need to be evaluated in person or the need to arrange testing (such as labs, EKG, etc.), we will make arrangements to do so.     Although advances in technology are sophisticated, we cannot ensure that it will always work on either your end or our end.  If the connection with a video visit is poor, the visit may have to be switched to a telephone visit.  With either a video or telephone visit, we are not always able to ensure that we have a secure connection.     I need to obtain your verbal consent now.   Are you willing to proceed with your visit today?    Lindsey Proctor has provided verbal consent on 06/11/2021 for a virtual visit (video or telephone).   Lindsey Loveless, PA-C   Date: 06/11/2021 8:39 AM   Virtual Visit via Video Note   I, Lindsey Proctor, connected with  Lindsey Proctor  ([id number], 1971-06-25) on 06/11/21 at  8:45 AM EDT by a video-enabled telemedicine application and verified that I am speaking with the correct person using two identifiers.  Location: Patient: Virtual Visit Location Patient: Other: work; isolated Provider: Engineer, mining Provider: Home Office   I discussed the limitations  of evaluation and management by telemedicine and the availability of in person appointments. The patient expressed understanding and agreed to proceed.    History of Present Illness: Lindsey Proctor is a 50 y.o. who identifies as a female who was assigned female at birth, and is being seen today for possible sinus infection.  HPI: Sinusitis This is a new problem. The current episode started in the past 7 days (sunday). The problem has been gradually worsening since onset. There has been no fever. Associated symptoms include congestion, coughing (dry), a hoarse voice, sinus pressure and a sore throat. Pertinent negatives include no chills or headaches. (Post nasal drainage) Past treatments include oral decongestants (robitussin and mucinex, BC powder this morning). The treatment provided no relief.    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 Covid testing at home is negative.   Problems:  Patient Active Problem List   Diagnosis Date Noted   Pre-op evaluation 01/09/2021   Special screening for malignant neoplasms, colon    Polyp of sigmoid colon    Depression with anxiety 04/14/2020   Primary osteoarthritis of left hip 12/14/2019   Post-nasal drainage 09/28/2018   Postmenopausal 09/16/2016   Vitamin B12 deficiency 09/08/2015   Vitamin D deficiency 09/08/2015   Fatigue 09/05/2015   Hypothyroidism 10/31/2014   Mild hyperlipidemia 10/05/2012   Encounter for general adult medical examination with abnormal findings 11/07/2011  Chronic cough 09/01/2011   Obesity, Class I, BMI 30-34.9 09/01/2011    Allergies: No Known Allergies Medications:  Current Outpatient Medications:    amoxicillin-clavulanate (AUGMENTIN) 875-125 MG tablet, Take 1 tablet by mouth 2 (two) times daily., Disp: 20 tablet, Rfl: 0   Cholecalciferol (VITAMIN D3) 25 MCG (1000 UT) CAPS, Take 1 capsule (1,000 Units total) by mouth daily., Disp: 30 capsule, Rfl:    diclofenac (VOLTAREN) 75 MG EC tablet, Take 1 tablet by mouth twice  daily, Disp: 30 tablet, Rfl: 0   DULoxetine (CYMBALTA) 30 MG capsule, Take 1 capsule (30 mg total) by mouth daily., Disp: 30 capsule, Rfl: 6   montelukast (SINGULAIR) 10 MG tablet, Take 1 tablet (10 mg total) by mouth at bedtime., Disp: 30 tablet, Rfl: 11  Observations/Objective: Patient is well-developed, well-nourished in no acute distress.  Resting comfortably at work, isolated Head is normocephalic, atraumatic.  No labored breathing. Speech is clear and coherent with logical content.  Patient is alert and oriented at baseline.    Assessment and Plan: 1. Acute bacterial sinusitis - amoxicillin-clavulanate (AUGMENTIN) 875-125 MG tablet; Take 1 tablet by mouth 2 (two) times daily.  Dispense: 20 tablet; Refill: 0 - Worsening symptoms that have not responded to OTC medications. Patient higher risk with fairly new hip replacement, will cover with broad-spectrum antibiotic to protect replacement - Will give augmentin as below.  - Continue allergy medications.  - Stay well hydrated and get plenty of rest.  - Seek in person evaluation if no symptom improvement or if symptoms worsen.   Follow Up Instructions: I discussed the assessment and treatment plan with the patient. The patient was provided an opportunity to ask questions and all were answered. The patient agreed with the plan and demonstrated an understanding of the instructions.  A copy of instructions were sent to the patient via MyChart.  The patient was advised to call back or seek an in-person evaluation if the symptoms worsen or if the condition fails to improve as anticipated.  Time:  I spent 10 minutes with the patient via telehealth technology discussing the above problems/concerns.    Lindsey Loveless, PA-C

## 2021-06-15 ENCOUNTER — Encounter: Payer: Self-pay | Admitting: Family Medicine

## 2021-06-18 MED ORDER — PREDNISONE 20 MG PO TABS: ORAL_TABLET | ORAL | 0 refills | Status: DC

## 2021-07-08 ENCOUNTER — Other Ambulatory Visit: Payer: Self-pay | Admitting: Family Medicine

## 2021-07-09 ENCOUNTER — Other Ambulatory Visit: Payer: Self-pay | Admitting: Family Medicine

## 2021-10-18 IMAGING — DX DG HIP (WITH OR WITHOUT PELVIS) 2-3V*L*
3 series · 3 of 3 positions shown · non-contrast
Comparison: None.

CLINICAL DATA: Left groin pain.

EXAM:
DG HIP (WITH OR WITHOUT PELVIS) 2-3V LEFT

[pelvis ap]
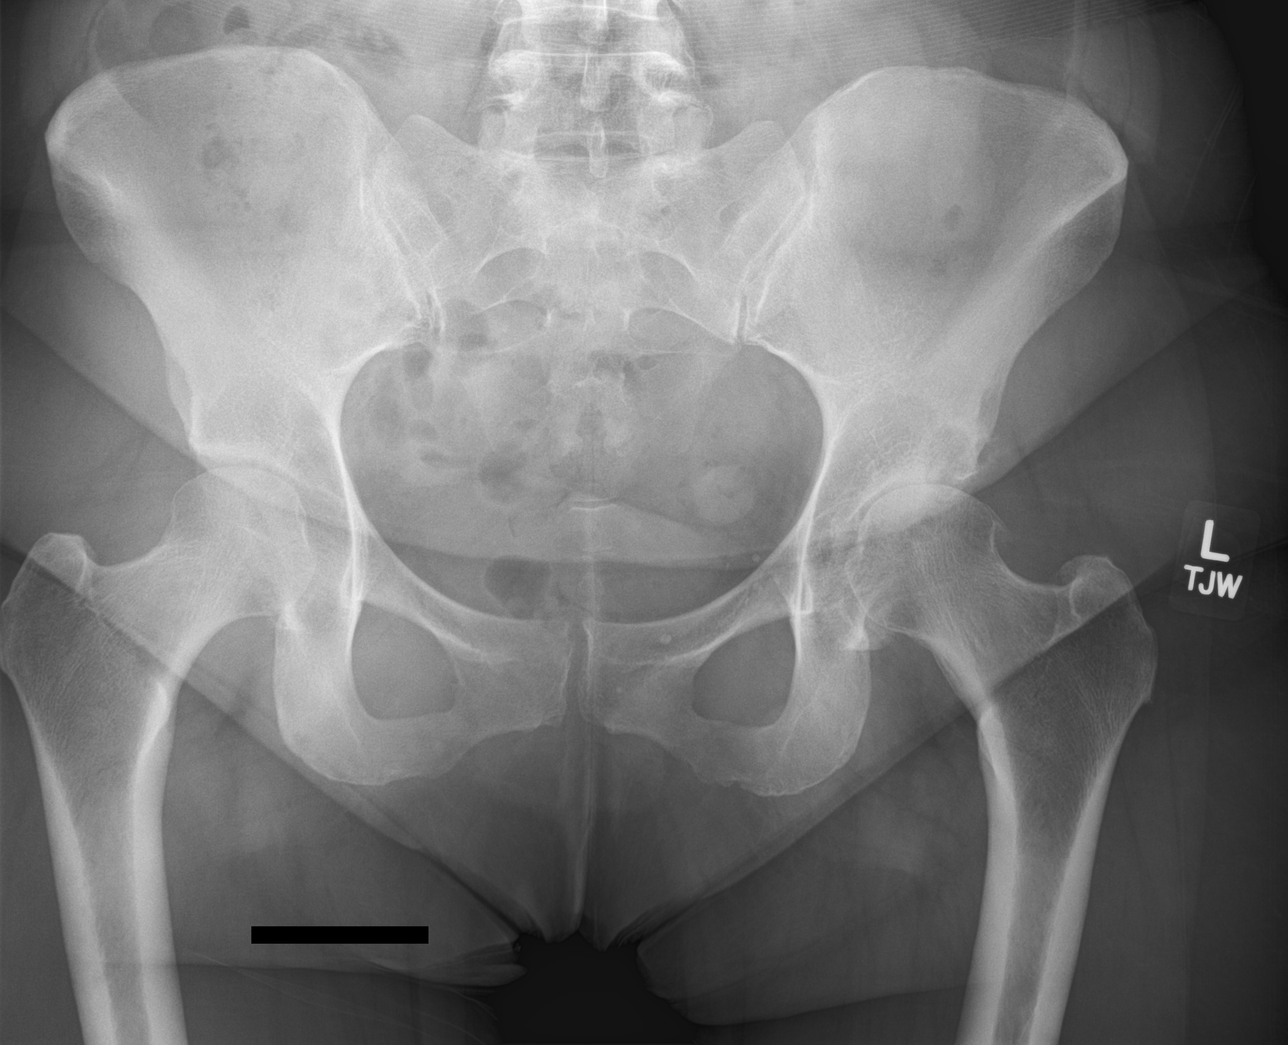

[hip ap]
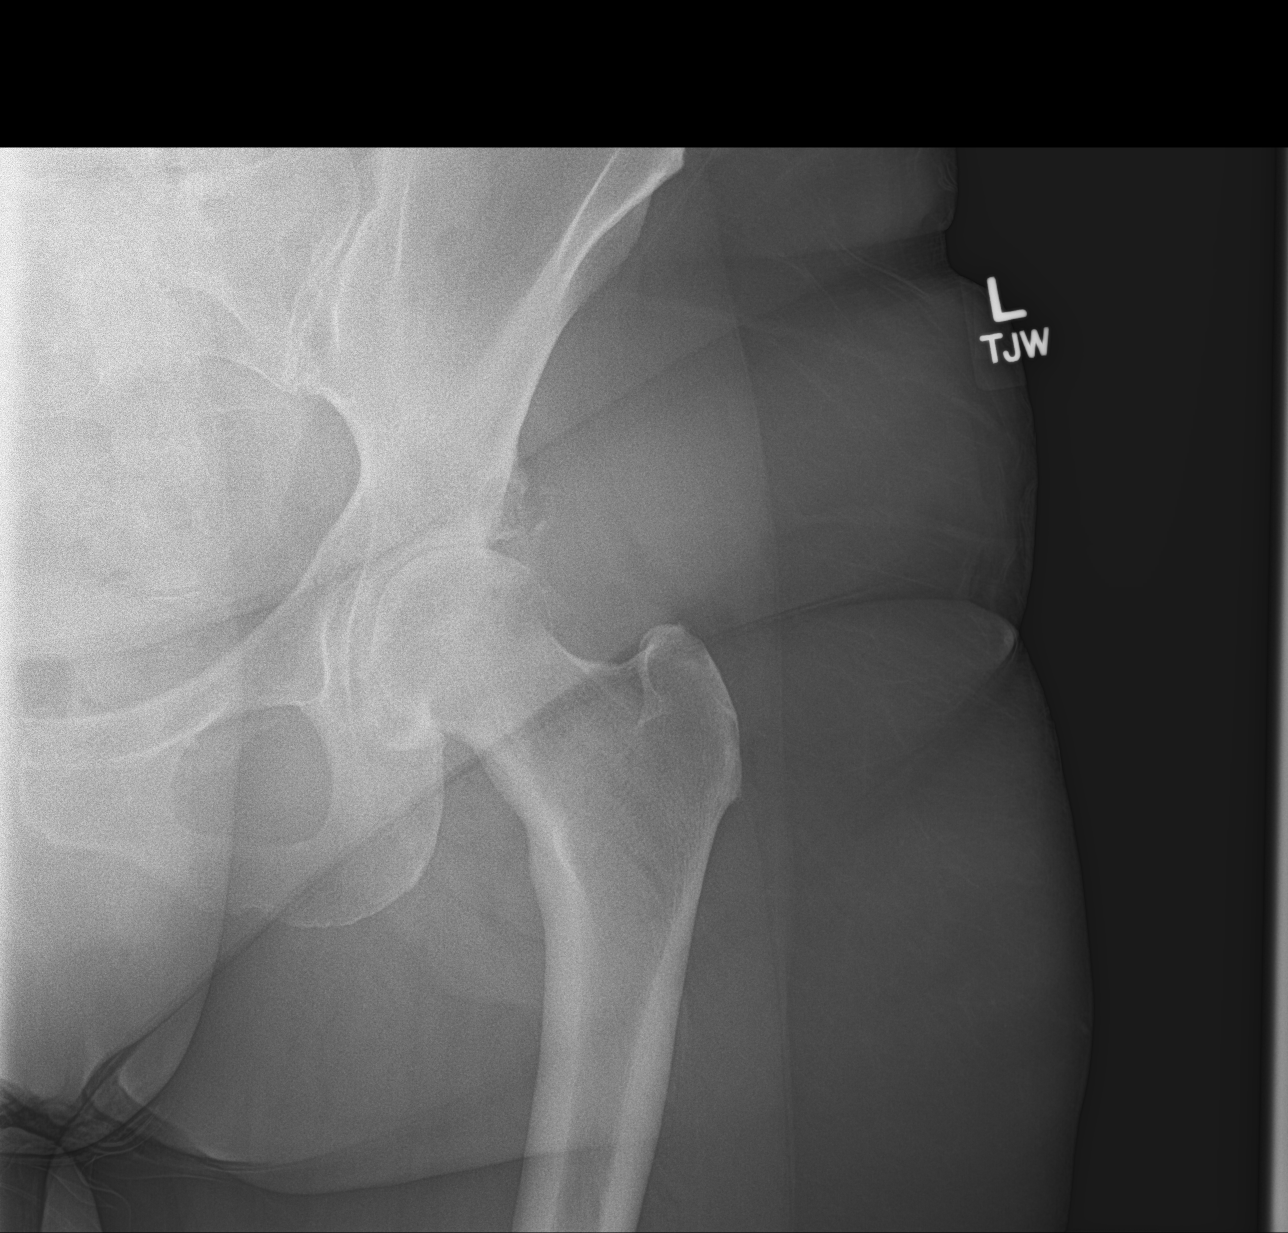

[hip lat]
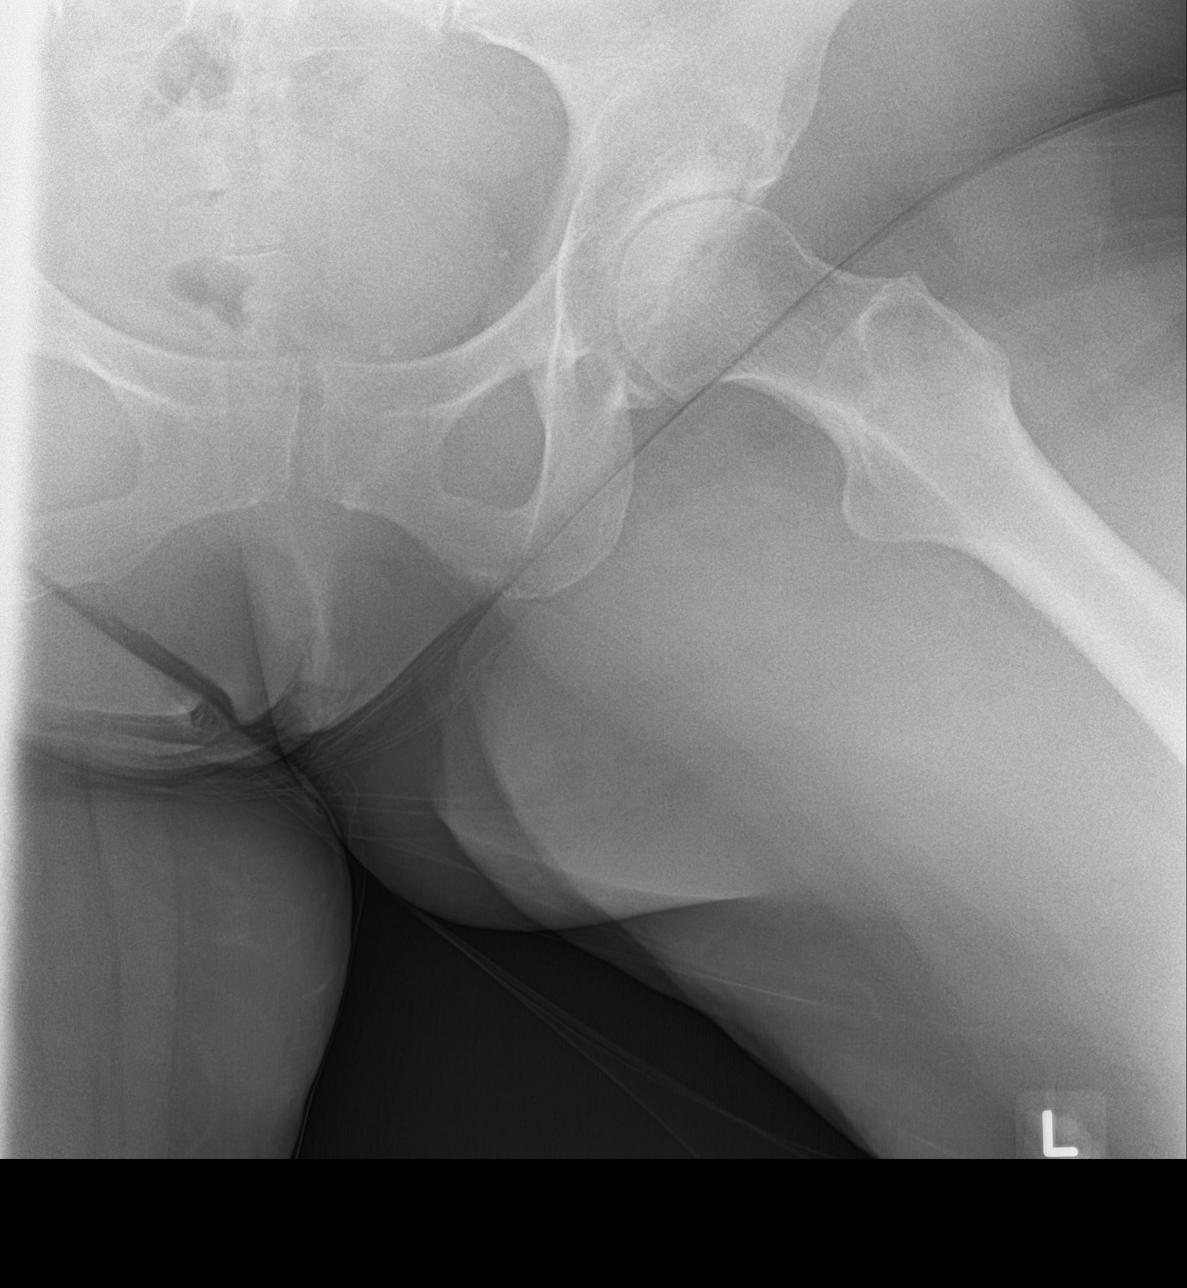

[3 of 3 positions shown; findings below may reference images not displayed]

FINDINGS: There is no evidence of hip fracture or dislocation. No lytic or
blastic lesions are identified. There is moderate severity narrowing
of the left hip joint. Subcentimeter phleboliths are noted within
the left hemipelvis.
IMPRESSION: 1. Moderate severity narrowing of the left hip joint without
evidence of acute osseous abnormality.

## 2021-12-04 ENCOUNTER — Ambulatory Visit: Payer: Commercial Managed Care - PPO | Admitting: Family Medicine

## 2021-12-17 ENCOUNTER — Other Ambulatory Visit: Payer: Self-pay

## 2021-12-17 ENCOUNTER — Ambulatory Visit (INDEPENDENT_AMBULATORY_CARE_PROVIDER_SITE_OTHER): Payer: Commercial Managed Care - PPO | Admitting: Family Medicine

## 2021-12-17 ENCOUNTER — Encounter: Payer: Self-pay | Admitting: Family Medicine

## 2021-12-17 VITALS — BP 132/82 | HR 67 | Temp 98.0°F | Ht 64.5 in | Wt 175.2 lb

## 2021-12-17 DIAGNOSIS — E039 Hypothyroidism, unspecified: Secondary | ICD-10-CM | POA: Diagnosis not present

## 2021-12-17 DIAGNOSIS — F418 Other specified anxiety disorders: Secondary | ICD-10-CM

## 2021-12-17 DIAGNOSIS — Z96642 Presence of left artificial hip joint: Secondary | ICD-10-CM | POA: Insufficient documentation

## 2021-12-17 DIAGNOSIS — E538 Deficiency of other specified B group vitamins: Secondary | ICD-10-CM

## 2021-12-17 DIAGNOSIS — E785 Hyperlipidemia, unspecified: Secondary | ICD-10-CM | POA: Diagnosis not present

## 2021-12-17 DIAGNOSIS — Z0000 Encounter for general adult medical examination without abnormal findings: Secondary | ICD-10-CM | POA: Diagnosis not present

## 2021-12-17 DIAGNOSIS — E559 Vitamin D deficiency, unspecified: Secondary | ICD-10-CM | POA: Diagnosis not present

## 2021-12-17 DIAGNOSIS — M1612 Unilateral primary osteoarthritis, left hip: Secondary | ICD-10-CM

## 2021-12-17 DIAGNOSIS — E663 Overweight: Secondary | ICD-10-CM

## 2021-12-17 DIAGNOSIS — Z780 Asymptomatic menopausal state: Secondary | ICD-10-CM

## 2021-12-17 LAB — CBC WITH DIFFERENTIAL/PLATELET
Basophils Absolute: 0 10*3/uL (ref 0.0–0.1)
Basophils Relative: 0.3 % (ref 0.0–3.0)
Eosinophils Absolute: 0 10*3/uL (ref 0.0–0.7)
Eosinophils Relative: 0.7 % (ref 0.0–5.0)
HCT: 38.1 % (ref 36.0–46.0)
Hemoglobin: 12.6 g/dL (ref 12.0–15.0)
Lymphocytes Relative: 26.3 % (ref 12.0–46.0)
Lymphs Abs: 1.7 10*3/uL (ref 0.7–4.0)
MCHC: 33 g/dL (ref 30.0–36.0)
MCV: 86.9 fl (ref 78.0–100.0)
Monocytes Absolute: 0.5 10*3/uL (ref 0.1–1.0)
Monocytes Relative: 8 % (ref 3.0–12.0)
Neutro Abs: 4.2 10*3/uL (ref 1.4–7.7)
Neutrophils Relative %: 64.7 % (ref 43.0–77.0)
Platelets: 219 10*3/uL (ref 150.0–400.0)
RBC: 4.39 Mil/uL (ref 3.87–5.11)
RDW: 14.1 % (ref 11.5–15.5)
WBC: 6.6 10*3/uL (ref 4.0–10.5)

## 2021-12-17 LAB — COMPREHENSIVE METABOLIC PANEL
ALT: 17 U/L (ref 0–35)
AST: 19 U/L (ref 0–37)
Albumin: 4.5 g/dL (ref 3.5–5.2)
Alkaline Phosphatase: 67 U/L (ref 39–117)
BUN: 14 mg/dL (ref 6–23)
CO2: 29 mEq/L (ref 19–32)
Calcium: 9.8 mg/dL (ref 8.4–10.5)
Chloride: 103 mEq/L (ref 96–112)
Creatinine, Ser: 0.75 mg/dL (ref 0.40–1.20)
GFR: 92.25 mL/min (ref >60.00)
Glucose, Bld: 91 mg/dL (ref 70–99)
Potassium: 4.1 mEq/L (ref 3.5–5.1)
Sodium: 140 mEq/L (ref 135–145)
Total Bilirubin: 0.4 mg/dL (ref 0.2–1.2)
Total Protein: 7.3 g/dL (ref 6.0–8.3)

## 2021-12-17 LAB — LIPID PANEL
Cholesterol: 254 mg/dL — ABNORMAL HIGH (ref 0–200)
HDL: 46.9 mg/dL (ref >39.00)
LDL Cholesterol: 173 mg/dL — ABNORMAL HIGH (ref 0–99)
NonHDL: 206.68
Total CHOL/HDL Ratio: 5
Triglycerides: 167 mg/dL — ABNORMAL HIGH (ref 0.0–149.0)
VLDL: 33.4 mg/dL (ref 0.0–40.0)

## 2021-12-17 LAB — VITAMIN B12: Vitamin B-12: 777 pg/mL (ref 211–911)

## 2021-12-17 LAB — T4, FREE: Free T4: 0.71 ng/dL (ref 0.60–1.60)

## 2021-12-17 LAB — TSH: TSH: 2.36 u[IU]/mL (ref 0.35–5.50)

## 2021-12-17 LAB — VITAMIN D 25 HYDROXY (VIT D DEFICIENCY, FRACTURES): VITD: 47.29 ng/mL (ref 30.00–100.00)

## 2021-12-17 MED ORDER — MONTELUKAST SODIUM 10 MG PO TABS: 10.0000 mg | ORAL_TABLET | Freq: Every day | ORAL | 3 refills | Status: DC

## 2021-12-17 MED ORDER — DULOXETINE HCL 20 MG PO CPEP: 20.0000 mg | ORAL_CAPSULE | Freq: Every day | ORAL | 6 refills | Status: DC

## 2021-12-17 NOTE — Assessment & Plan Note (Signed)
Regularly sees GYN.  

## 2021-12-17 NOTE — Assessment & Plan Note (Addendum)
H/o this but has been off thyroid replacement for several years - noting increasing brittle nails and hair changes. Update TFTs.

## 2021-12-17 NOTE — Assessment & Plan Note (Signed)
Congratulated on weight loss to date. Encouraged ongoing healthy diet and lifestyle choices to affect sustainable weight loss.  °

## 2021-12-17 NOTE — Assessment & Plan Note (Signed)
Update levels on daily OTC replacement °

## 2021-12-17 NOTE — Assessment & Plan Note (Signed)
S/p hip replacement with benefit.

## 2021-12-17 NOTE — Assessment & Plan Note (Signed)
Update levels on daily OTC replacement

## 2021-12-17 NOTE — Assessment & Plan Note (Signed)
Preventative protocols reviewed and updated unless pt declined. Discussed healthy diet and lifestyle.  

## 2021-12-17 NOTE — Patient Instructions (Addendum)
Labs today  If interested, check with pharmacy about 2 shot shingles series (shingrix).  You are doing well today Return as needed or in 1 year for next physical.  Health Maintenance for Postmenopausal Women Menopause is a normal process in which your ability to get pregnant comes to an end. This process happens slowly over many months or years, usually between the ages of 91 and 82. Menopause is complete when you have missed your menstrual period for 12 months. It is important to talk with your health care provider about some of the most common conditions that affect women after menopause (postmenopausal women). These include heart disease, cancer, and bone loss (osteoporosis). Adopting a healthy lifestyle and getting preventive care can help to promote your health and wellness. The actions you take can also lower your chances of developing some of these common conditions. What are the signs and symptoms of menopause? During menopause, you may have the following symptoms: Hot flashes. These can be moderate or severe. Night sweats. Decrease in sex drive. Mood swings. Headaches. Tiredness (fatigue). Irritability. Memory problems. Problems falling asleep or staying asleep. Talk with your health care provider about treatment options for your symptoms. Do I need hormone replacement therapy? Hormone replacement therapy is effective in treating symptoms that are caused by menopause, such as hot flashes and night sweats. Hormone replacement carries certain risks, especially as you become older. If you are thinking about using estrogen or estrogen with progestin, discuss the benefits and risks with your health care provider. How can I reduce my risk for heart disease and stroke? The risk of heart disease, heart attack, and stroke increases as you age. One of the causes may be a change in the body's hormones during menopause. This can affect how your body uses dietary fats, triglycerides, and  cholesterol. Heart attack and stroke are medical emergencies. There are many things that you can do to help prevent heart disease and stroke. Watch your blood pressure High blood pressure causes heart disease and increases the risk of stroke. This is more likely to develop in people who have high blood pressure readings or are overweight. Have your blood pressure checked: Every 3-5 years if you are 73-66 years of age. Every year if you are 33 years old or older. Eat a healthy diet  Eat a diet that includes plenty of vegetables, fruits, low-fat dairy products, and lean protein. Do not eat a lot of foods that are high in solid fats, added sugars, or sodium. Get regular exercise Get regular exercise. This is one of the most important things you can do for your health. Most adults should: Try to exercise for at least 150 minutes each week. The exercise should increase your heart rate and make you sweat (moderate-intensity exercise). Try to do strengthening exercises at least twice each week. Do these in addition to the moderate-intensity exercise. Spend less time sitting. Even light physical activity can be beneficial. Other tips Work with your health care provider to achieve or maintain a healthy weight. Do not use any products that contain nicotine or tobacco. These products include cigarettes, chewing tobacco, and vaping devices, such as e-cigarettes. If you need help quitting, ask your health care provider. Know your numbers. Ask your health care provider to check your cholesterol and your blood sugar (glucose). Continue to have your blood tested as directed by your health care provider. Do I need screening for cancer? Depending on your health history and family history, you may need to have cancer  screenings at different stages of your life. This may include screening for: Breast cancer. Cervical cancer. Lung cancer. Colorectal cancer. What is my risk for osteoporosis? After menopause, you  may be at increased risk for osteoporosis. Osteoporosis is a condition in which bone destruction happens more quickly than new bone creation. To help prevent osteoporosis or the bone fractures that can happen because of osteoporosis, you may take the following actions: If you are 53-29 years old, get at least 1,000 mg of calcium and at least 600 international units (IU) of vitamin D per day. If you are older than age 33 but younger than age 24, get at least 1,200 mg of calcium and at least 600 international units (IU) of vitamin D per day. If you are older than age 63, get at least 1,200 mg of calcium and at least 800 international units (IU) of vitamin D per day. Smoking and drinking excessive alcohol increase the risk of osteoporosis. Eat foods that are rich in calcium and vitamin D, and do weight-bearing exercises several times each week as directed by your health care provider. How does menopause affect my mental health? Depression may occur at any age, but it is more common as you become older. Common symptoms of depression include: Feeling depressed. Changes in sleep patterns. Changes in appetite or eating patterns. Feeling an overall lack of motivation or enjoyment of activities that you previously enjoyed. Frequent crying spells. Talk with your health care provider if you think that you are experiencing any of these symptoms. General instructions See your health care provider for regular wellness exams and vaccines. This may include: Scheduling regular health, dental, and eye exams. Getting and maintaining your vaccines. These include: Influenza vaccine. Get this vaccine each year before the flu season begins. Pneumonia vaccine. Shingles vaccine. Tetanus, diphtheria, and pertussis (Tdap) booster vaccine. Your health care provider may also recommend other immunizations. Tell your health care provider if you have ever been abused or do not feel safe at home. Summary Menopause is a  normal process in which your ability to get pregnant comes to an end. This condition causes hot flashes, night sweats, decreased interest in sex, mood swings, headaches, or lack of sleep. Treatment for this condition may include hormone replacement therapy. Take actions to keep yourself healthy, including exercising regularly, eating a healthy diet, watching your weight, and checking your blood pressure and blood sugar levels. Get screened for cancer and depression. Make sure that you are up to date with all your vaccines. This information is not intended to replace advice given to you by your health care provider. Make sure you discuss any questions you have with your health care provider. Document Revised: 03/25/2021 Document Reviewed: 03/25/2021 Elsevier Patient Education  2022 [employer].

## 2021-12-17 NOTE — Assessment & Plan Note (Signed)
Chronic, stable period on cymbalta 30mg  daily - she desires to work towards slow taper off cymbalta - will drop dose to 20mg  daily for 1-2 months and then if doing well may trial off.

## 2021-12-17 NOTE — Progress Notes (Signed)
Patient ID: Lindsey Proctor, female    DOB: 1971/03/19, 51 y.o.   MRN: [id number]  This visit was conducted in person.  BP 132/82    Pulse 67    Temp 98 F (36.7 C) (Temporal)    Ht 5' 4.5" (1.638 m)    Wt 175 lb 4 oz (79.5 kg)    SpO2 97%    BMI 29.62 kg/m    CC: CPE Subjective:   HPI: Lindsey Proctor is a 51 y.o. female presenting on 12/17/2021 for Annual Exam (Wants to discuss Cymbalta. )   Last CPE 12/2020. States insurance covers one physical per calendar year.   Chronic L hip pain s/p hip replacement 01/15/2021 ([facility]). Significant improvement after this.    Depression/anxiety - has been on cymbalta 30mg  daily - unsure if this has significantly helped. New promotion at work - increased stress but overall feeling well. Would like to taper down dose and possibly try off.    Continues vit D 1000 IU and vit B12 replacement daily (?5051mcg).    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   Noticing more brittle nails and hair - h/o subclinical hypothyroidism.  No significant fatigue   Preventative: COLONOSCOPY WITH PROPOFOL 11/02/2020 - [facility], rpt 10 yrs Allen Norris, Darren, MD) [facility] - [place] with Dr Carlis Abbott, Q2 yrs, last seen 01/2021 due to vaginal bleed - HRT stopped at that time.  LMP - 08/2015.  Mammogram - yearly, through GYN ([facility]) last 01/2021 WNL Lung cancer screen - not eligible  Flu shot yearly  COVID vaccine - did not do Tdap 2012  Shingrix - discussed will start today Seat belt use discussed.  Sunscreen use discussed. No changing moles on skin.  Non smoker - mother smokes outside Alcohol - none  Dentist q6 mo  Eye exam yearly    Caffeine: 2 20oz caffeinated drinks/day (diet mountain dew) Lives with husband, 2 children (son and daughter), 1 dog and cat Occupation: customer service call  center Activity: aerobic exercise at home 3x/wk Diet: fruits and vegetables daily, lots of water, red meat 4 times a week, fish rarely, doing some intermittent fasting     Relevant past medical, surgical, family and social history reviewed and updated as indicated. Interim medical history since our last visit reviewed. Allergies and medications reviewed and updated. Outpatient Medications Prior to Visit  Medication Sig Dispense Refill   Cholecalciferol (VITAMIN D3) 25 MCG (1000 UT) CAPS Take 1 capsule (1,000 Units total) by mouth daily. 30 capsule    Cyanocobalamin (VITAMIN B-12) 5000 MCG TBDP Take 1 tablet by mouth daily.     DULoxetine (CYMBALTA) 30 MG capsule Take 1 capsule by mouth once daily 90 capsule 3   montelukast (SINGULAIR) 10 MG tablet Take 1 tablet (10 mg total) by mouth at bedtime. 30 tablet 11   amoxicillin-clavulanate (AUGMENTIN) 875-125 MG tablet Take 1 tablet by mouth 2 (two) times daily. 20 tablet 0   diclofenac (VOLTAREN) 75 MG EC tablet Take 1 tablet by mouth twice daily 30 tablet 0   predniSONE (DELTASONE) 20 MG tablet Take two tablets daily for 3 days followed by one tablet daily for 4 days 10 tablet 0   No facility-administered medications prior to visit.     Per HPI unless specifically indicated in ROS section below Review of Systems  Constitutional:  Negative  for activity change, appetite change, chills, fatigue, fever and unexpected weight change.  HENT:  Negative for hearing loss.   Eyes:  Negative for visual disturbance.  Respiratory:  Negative for cough, chest tightness, shortness of breath and wheezing.   Cardiovascular:  Negative for chest pain, palpitations and leg swelling.  Gastrointestinal:  Negative for abdominal distention, abdominal pain, blood in stool, constipation, diarrhea, nausea and vomiting.  Genitourinary:  Negative for difficulty urinating and hematuria.  Musculoskeletal:  Negative for arthralgias, myalgias and neck pain.  Skin:  Negative  for rash.  Neurological:  Negative for dizziness, seizures, syncope and headaches.  Hematological:  Negative for adenopathy. Does not bruise/bleed easily.  Psychiatric/Behavioral:  Negative for dysphoric mood. The patient is not nervous/anxious.    Objective:  BP 132/82    Pulse 67    Temp 98 F (36.7 C) (Temporal)    Ht 5' 4.5" (1.638 m)    Wt 175 lb 4 oz (79.5 kg)    SpO2 97%    BMI 29.62 kg/m   Wt Readings from Last 3 Encounters:  12/17/21 175 lb 4 oz (79.5 kg)  01/09/21 189 lb 9 oz (86 kg)  11/02/20 181 lb (82.1 kg)      Physical Exam Vitals and nursing note reviewed.  Constitutional:      Appearance: Normal appearance. She is not ill-appearing.  HENT:     Head: Normocephalic and atraumatic.     Right Ear: Tympanic membrane, ear canal and external ear normal. There is no impacted cerumen.     Left Ear: Tympanic membrane, ear canal and external ear normal. There is no impacted cerumen.  Eyes:     General:        Right eye: No discharge.        Left eye: No discharge.     Extraocular Movements: Extraocular movements intact.     Conjunctiva/sclera: Conjunctivae normal.     Pupils: Pupils are equal, round, and reactive to light.  Neck:     Thyroid: No thyroid mass or thyromegaly.  Cardiovascular:     Rate and Rhythm: Normal rate and regular rhythm.     Pulses: Normal pulses.     Heart sounds: Normal heart sounds. No murmur heard. Pulmonary:     Effort: Pulmonary effort is normal. No respiratory distress.     Breath sounds: Normal breath sounds. No wheezing, rhonchi or rales.  Abdominal:     General: Bowel sounds are normal. There is no distension.     Palpations: Abdomen is soft. There is no mass.     Tenderness: There is no abdominal tenderness. There is no guarding or rebound.     Hernia: No hernia is present.  Musculoskeletal:     Cervical back: Normal range of motion and neck supple. No rigidity.     Right lower leg: No edema.     Left lower leg: No edema.   Lymphadenopathy:     Cervical: No cervical adenopathy.  Skin:    General: Skin is warm and dry.     Findings: No rash.     Comments: Small 1+cm circumscribed firm lesion to right lower back - presume lipoma   Neurological:     General: No focal deficit present.     Mental Status: She is alert. Mental status is at baseline.  Psychiatric:        Mood and Affect: Mood normal.        Behavior: Behavior normal.      Results  for orders placed or performed in visit on 01/09/21  Lipid panel  Result Value Ref Range   Cholesterol 249 (H) 0 - 200 mg/dL   Triglycerides 121.0 0.0 - 149.0 mg/dL   HDL 55.90 >39.00 mg/dL   VLDL 24.2 0.0 - 40.0 mg/dL   LDL Cholesterol 169 (H) 0 - 99 mg/dL   Total CHOL/HDL Ratio 4    NonHDL 193.03   Comprehensive metabolic panel  Result Value Ref Range   Sodium 141 135 - 145 mEq/L   Potassium 3.8 3.5 - 5.1 mEq/L   Chloride 103 96 - 112 mEq/L   CO2 31 19 - 32 mEq/L   Glucose, Bld 86 70 - 99 mg/dL   BUN 13 6 - 23 mg/dL   Creatinine, Ser 0.63 0.40 - 1.20 mg/dL   Total Bilirubin 0.4 0.2 - 1.2 mg/dL   Alkaline Phosphatase 74 39 - 117 U/L   AST 16 0 - 37 U/L   ALT 18 0 - 35 U/L   Total Protein 7.6 6.0 - 8.3 g/dL   Albumin 4.6 3.5 - 5.2 g/dL   GFR 103.46 >60.00 mL/min   Calcium 9.6 8.4 - 10.5 mg/dL  CBC with Differential/Platelet  Result Value Ref Range   WBC 7.4 4.0 - 10.5 K/uL   RBC 4.40 3.87 - 5.11 Mil/uL   Hemoglobin 13.1 12.0 - 15.0 g/dL   HCT 39.2 36.0 - 46.0 %   MCV 89.1 78.0 - 100.0 fl   MCHC 33.5 30.0 - 36.0 g/dL   RDW 14.0 11.5 - 15.5 %   Platelets 220.0 150.0 - 400.0 K/uL   Neutrophils Relative % 64.3 43.0 - 77.0 %   Lymphocytes Relative 27.6 12.0 - 46.0 %   Monocytes Relative 7.1 3.0 - 12.0 %   Eosinophils Relative 0.6 0.0 - 5.0 %   Basophils Relative 0.4 0.0 - 3.0 %   Neutro Abs 4.8 1.4 - 7.7 K/uL   Lymphs Abs 2.1 0.7 - 4.0 K/uL   Monocytes Absolute 0.5 0.1 - 1.0 K/uL   Eosinophils Absolute 0.0 0.0 - 0.7 K/uL   Basophils Absolute 0.0  0.0 - 0.1 K/uL  Protime-INR  Result Value Ref Range   INR 0.9 0.8 - 1.0 ratio   Prothrombin Time 10.1 9.6 - 13.1 sec  Vitamin B12  Result Value Ref Range   Vitamin B-12 424 211 - 911 pg/mL  VITAMIN D 25 Hydroxy (Vit-D Deficiency, Fractures)  Result Value Ref Range   VITD 31.74 30.00 - 100.00 ng/mL  TSH  Result Value Ref Range   TSH 1.99 0.35 - 4.50 uIU/mL  T4, free  Result Value Ref Range   Free T4 0.68 0.60 - 1.60 ng/dL    Assessment & Plan:  This visit occurred during the SARS-CoV-2 public health emergency.  Safety protocols were in place, including screening questions prior to the visit, additional usage of staff PPE, and extensive cleaning of exam room while observing appropriate contact time as indicated for disinfecting solutions.   Problem List Items Addressed This Visit     Health maintenance examination - Primary (Chronic)    Preventative protocols reviewed and updated unless pt declined. Discussed healthy diet and lifestyle.       Overweight (BMI 25.0-29.9)    Congratulated on weight loss to date! Encouraged ongoing healthy diet and lifestyle choices to affect sustainable weight loss.       Mild hyperlipidemia    Chronic off meds - update FLP. The 10-year ASCVD risk score (Arnett DK, et  al., 2019) is: 1.9%   Values used to calculate the score:     Age: 70 years     Sex: Female     Is Non-Hispanic African American: No     Diabetic: No     Tobacco smoker: No     Systolic Blood Pressure: Q000111Q mmHg     Is BP treated: No     HDL Cholesterol: 55.9 mg/dL     Total Cholesterol: 249 mg/dL       Relevant Orders   Lipid panel   Comprehensive metabolic panel   Hypothyroidism    H/o this but has been off thyroid replacement for several years - noting increasing brittle nails and hair changes. Update TFTs.      Relevant Orders   TSH   T4, free   CBC with Differential/Platelet   Vitamin B12 deficiency    Update levels on daily OTC replacement      Relevant  Orders   Vitamin B12   Vitamin D deficiency    Update levels on daily OTC replacement.       Relevant Orders   VITAMIN D 25 Hydroxy (Vit-D Deficiency, Fractures)   Postmenopausal    Regularly sees GYN      Primary osteoarthritis of left hip    S/p hip replacement with benefit.       Depression with anxiety    Chronic, stable period on cymbalta 30mg  daily - she desires to work towards slow taper off cymbalta - will drop dose to 20mg  daily for 1-2 months and then if doing well may trial off.       Relevant Medications   DULoxetine (CYMBALTA) 20 MG capsule   Status post left hip replacement     Meds ordered this encounter  Medications   DULoxetine (CYMBALTA) 20 MG capsule    Sig: Take 1 capsule (20 mg total) by mouth daily.    Dispense:  30 capsule    Refill:  6   montelukast (SINGULAIR) 10 MG tablet    Sig: Take 1 tablet (10 mg total) by mouth at bedtime.    Dispense:  30 tablet    Refill:  3   Orders Placed This Encounter  Procedures   Vitamin B12   VITAMIN D 25 Hydroxy (Vit-D Deficiency, Fractures)   Lipid panel   Comprehensive metabolic panel   TSH   T4, free   CBC with Differential/Platelet     Patient instructions: Labs today  If interested, check with pharmacy about 2 shot shingles series (shingrix).  You are doing well today Return as needed or in 1 year for next physical.  Follow up plan: Return in about 1 year (around 12/17/2022) for annual exam, prior fasting for blood work.  Ria Bush, MD

## 2021-12-17 NOTE — Assessment & Plan Note (Signed)
Chronic off meds - update FLP. The 10-year ASCVD risk score (Arnett DK, et al., 2019) is: 1.9%   Values used to calculate the score:     Age: 51 years     Sex: Female     Is Non-Hispanic African American: No     Diabetic: No     Tobacco smoker: No     Systolic Blood Pressure: Q000111Q mmHg     Is BP treated: No     HDL Cholesterol: 55.9 mg/dL     Total Cholesterol: 249 mg/dL

## 2021-12-20 ENCOUNTER — Encounter: Payer: Self-pay | Admitting: Family Medicine

## 2021-12-21 ENCOUNTER — Encounter: Payer: Self-pay | Admitting: Family Medicine

## 2021-12-24 ENCOUNTER — Encounter: Payer: Self-pay | Admitting: Family Medicine

## 2021-12-24 MED ORDER — DULOXETINE HCL 30 MG PO CPEP: 30.0000 mg | ORAL_CAPSULE | Freq: Every day | ORAL | 3 refills | Status: DC

## 2022-02-05 ENCOUNTER — Telehealth: Payer: Commercial Managed Care - PPO | Admitting: Physician Assistant

## 2022-02-05 DIAGNOSIS — R3989 Other symptoms and signs involving the genitourinary system: Secondary | ICD-10-CM

## 2022-02-05 MED ORDER — CEPHALEXIN 500 MG PO CAPS: 500.0000 mg | ORAL_CAPSULE | Freq: Two times a day (BID) | ORAL | 0 refills | Status: AC

## 2022-02-05 NOTE — Progress Notes (Signed)
I have spent 5 minutes in review of e-visit questionnaire, review and updating patient chart, medical decision making and response to patient.   Evans Levee Cody Rosabell Geyer, PA-C    

## 2022-02-05 NOTE — Progress Notes (Signed)

## 2022-03-13 ENCOUNTER — Encounter: Payer: Self-pay | Admitting: Family Medicine

## 2022-03-13 MED ORDER — DULOXETINE HCL 40 MG PO CPEP
40.0000 mg | ORAL_CAPSULE | Freq: Every day | ORAL | 2 refills | Status: DC
Start: 2022-03-13 — End: 2023-01-07

## 2022-10-28 ENCOUNTER — Telehealth: Payer: Commercial Managed Care - PPO | Admitting: Physician Assistant

## 2022-10-28 DIAGNOSIS — B9689 Other specified bacterial agents as the cause of diseases classified elsewhere: Secondary | ICD-10-CM | POA: Diagnosis not present

## 2022-10-28 DIAGNOSIS — J208 Acute bronchitis due to other specified organisms: Secondary | ICD-10-CM

## 2022-10-28 MED ORDER — AZITHROMYCIN 250 MG PO TABS: ORAL_TABLET | ORAL | 0 refills | Status: AC

## 2022-10-28 MED ORDER — BENZONATATE 100 MG PO CAPS: 100.0000 mg | ORAL_CAPSULE | Freq: Three times a day (TID) | ORAL | 0 refills | Status: DC | PRN

## 2022-10-28 MED ORDER — PREDNISONE 10 MG (21) PO TBPK: ORAL_TABLET | ORAL | 0 refills | Status: DC

## 2022-10-28 NOTE — Progress Notes (Signed)
We are sorry that you are not feeling well.  Here is how we plan to help!  Based on your presentation I believe you most likely have A cough due to bacteria.  When patients have a fever and a productive cough with a change in color or increased sputum production, we are concerned about bacterial bronchitis.  If left untreated it can progress to pneumonia.  If your symptoms do not improve with your treatment plan it is important that you contact your provider.   I have prescribed Azithromyin 250 mg: two tablets now and then one tablet daily for 4 additonal days    In addition you may use A non-prescription cough medication called Mucinex DM: take 2 tablets every 12 hours. and A prescription cough medication called Tessalon Perles 100mg. You may take 1-2 capsules every 8 hours as needed for your cough.  Prednisone 10 mg daily for 6 days (see taper instructions below)  Directions for 6 day taper: Day 1: 2 tablets before breakfast, 1 after both lunch & dinner and 2 at bedtime Day 2: 1 tab before breakfast, 1 after both lunch & dinner and 2 at bedtime Day 3: 1 tab at each meal & 1 at bedtime Day 4: 1 tab at breakfast, 1 at lunch, 1 at bedtime Day 5: 1 tab at breakfast & 1 tab at bedtime Day 6: 1 tab at breakfast  From your responses in the eVisit questionnaire you describe inflammation in the upper respiratory tract which is causing a significant cough.  This is commonly called Bronchitis and has four common causes:   Allergies Viral Infections Acid Reflux Bacterial Infection Allergies, viruses and acid reflux are treated by controlling symptoms or eliminating the cause. An example might be a cough caused by taking certain blood pressure medications. You stop the cough by changing the medication. Another example might be a cough caused by acid reflux. Controlling the reflux helps control the cough.  USE OF BRONCHODILATOR ("RESCUE") INHALERS: There is a risk from using your bronchodilator too  frequently.  The risk is that over-reliance on a medication which only relaxes the muscles surrounding the breathing tubes can reduce the effectiveness of medications prescribed to reduce swelling and congestion of the tubes themselves.  Although you feel brief relief from the bronchodilator inhaler, your asthma may actually be worsening with the tubes becoming more swollen and filled with mucus.  This can delay other crucial treatments, such as oral steroid medications. If you need to use a bronchodilator inhaler daily, several times per day, you should discuss this with your provider.  There are probably better treatments that could be used to keep your asthma under control.     HOME CARE Only take medications as instructed by your medical team. Complete the entire course of an antibiotic. Drink plenty of fluids and get plenty of rest. Avoid close contacts especially the very young and the elderly Cover your mouth if you cough or cough into your sleeve. Always remember to wash your hands A steam or ultrasonic humidifier can help congestion.   GET HELP RIGHT AWAY IF: You develop worsening fever. You become short of breath You cough up blood. Your symptoms persist after you have completed your treatment plan MAKE SURE YOU  Understand these instructions. Will watch your condition. Will get help right away if you are not doing well or get worse.    Thank you for choosing an e-visit.  Your e-visit answers were reviewed by a board certified advanced clinical   practitioner to complete your personal care plan. Depending upon the condition, your plan could have included both over the counter or prescription medications.  Please review your pharmacy choice. Make sure the pharmacy is open so you can pick up prescription now. If there is a problem, you may contact your provider through MyChart messaging and have the prescription routed to another pharmacy.  Your safety is important to us. If you have  drug allergies check your prescription carefully.   For the next 24 hours you can use MyChart to ask questions about today's visit, request a non-urgent call back, or ask for a work or school excuse. You will get an email in the next two days asking about your experience. I hope that your e-visit has been valuable and will speed your recovery.  I have spent 5 minutes in review of e-visit questionnaire, review and updating patient chart, medical decision making and response to patient.   Brantlee Hinde M Daysie Helf, PA-C  

## 2022-11-12 ENCOUNTER — Telehealth: Payer: Commercial Managed Care - PPO | Admitting: Physician Assistant

## 2022-11-12 DIAGNOSIS — R051 Acute cough: Secondary | ICD-10-CM

## 2022-11-12 NOTE — Progress Notes (Signed)
Because you have failed the 1st line treatment, I feel your condition warrants further evaluation and I recommend that you be seen in a face to face visit.   NOTE: There will be NO CHARGE for this eVisit   If you are having a true medical emergency please call 911.      For an urgent face to face visit, [facility] has seven urgent care centers for your convenience:     [facility] [place] Get Driving Directions [phone] [address] [place], [place] [address]    [facility] ([place]) Get Driving Directions [phone] [address] [place], [place] [address]  [facility] ([place] [facility] [place]) Get Driving Directions [phone] [address] [place],  [place]  [address]  [facility] ([place] [facility] [place] Get Driving Directions  [phone] [address] ,  [place] [address]   [facility] Get Driving Directions [phone] [address] [place], [place] [address]   [facility] [place] Get Driving Directions  [phone] [address] [place], [place] [address]   [facility] [place] Get Driving Directions [phone] [address] [place], [place] [address]  Your MyChart E-visit questionnaire answers were reviewed by a board certified advanced clinical practitioner to complete your personal care plan based on your specific symptoms.  Thank you for using e-Visits.   I have spent 5 minutes in review of e-visit questionnaire, review and updating patient chart, medical decision making and response to patient.   Shevy Yaney M Apollo Timothy, PA-C  

## 2022-11-27 ENCOUNTER — Other Ambulatory Visit: Payer: Self-pay | Admitting: Obstetrics

## 2022-11-27 DIAGNOSIS — R928 Other abnormal and inconclusive findings on diagnostic imaging of breast: Secondary | ICD-10-CM

## 2022-12-08 ENCOUNTER — Ambulatory Visit: Admission: RE | Admit: 2022-12-08 | Payer: Commercial Managed Care - PPO | Source: Ambulatory Visit

## 2022-12-08 ENCOUNTER — Ambulatory Visit
Admission: RE | Admit: 2022-12-08 | Discharge: 2022-12-08 | Disposition: A | Discharge status: Home / Self Care | Payer: Commercial Managed Care - PPO | Source: Ambulatory Visit | Attending: Obstetrics | Admitting: Obstetrics

## 2022-12-08 DIAGNOSIS — R928 Other abnormal and inconclusive findings on diagnostic imaging of breast: Secondary | ICD-10-CM

## 2022-12-28 ENCOUNTER — Other Ambulatory Visit: Payer: Self-pay | Admitting: Family Medicine

## 2022-12-28 DIAGNOSIS — E782 Mixed hyperlipidemia: Secondary | ICD-10-CM

## 2022-12-28 DIAGNOSIS — Z1159 Encounter for screening for other viral diseases: Secondary | ICD-10-CM

## 2022-12-28 DIAGNOSIS — E559 Vitamin D deficiency, unspecified: Secondary | ICD-10-CM

## 2022-12-28 DIAGNOSIS — E538 Deficiency of other specified B group vitamins: Secondary | ICD-10-CM

## 2022-12-28 DIAGNOSIS — E039 Hypothyroidism, unspecified: Secondary | ICD-10-CM

## 2022-12-31 ENCOUNTER — Other Ambulatory Visit (INDEPENDENT_AMBULATORY_CARE_PROVIDER_SITE_OTHER): Payer: Commercial Managed Care - PPO

## 2022-12-31 DIAGNOSIS — E782 Mixed hyperlipidemia: Secondary | ICD-10-CM

## 2022-12-31 DIAGNOSIS — E039 Hypothyroidism, unspecified: Secondary | ICD-10-CM | POA: Diagnosis not present

## 2022-12-31 DIAGNOSIS — E559 Vitamin D deficiency, unspecified: Secondary | ICD-10-CM | POA: Diagnosis not present

## 2022-12-31 DIAGNOSIS — Z1159 Encounter for screening for other viral diseases: Secondary | ICD-10-CM | POA: Diagnosis not present

## 2022-12-31 DIAGNOSIS — E538 Deficiency of other specified B group vitamins: Secondary | ICD-10-CM | POA: Diagnosis not present

## 2022-12-31 LAB — LIPID PANEL
Cholesterol: 230 mg/dL — ABNORMAL HIGH (ref 0–200)
HDL: 47.6 mg/dL (ref >39.00)
LDL Cholesterol: 162 mg/dL — ABNORMAL HIGH (ref 0–99)
NonHDL: 182.41
Total CHOL/HDL Ratio: 5
Triglycerides: 104 mg/dL (ref 0.0–149.0)
VLDL: 20.8 mg/dL (ref 0.0–40.0)

## 2022-12-31 LAB — COMPREHENSIVE METABOLIC PANEL
ALT: 15 U/L (ref 0–35)
AST: 16 U/L (ref 0–37)
Albumin: 4.2 g/dL (ref 3.5–5.2)
Alkaline Phosphatase: 61 U/L (ref 39–117)
BUN: 18 mg/dL (ref 6–23)
CO2: 28 mEq/L (ref 19–32)
Calcium: 9.3 mg/dL (ref 8.4–10.5)
Chloride: 106 mEq/L (ref 96–112)
Creatinine, Ser: 0.73 mg/dL (ref 0.40–1.20)
GFR: 94.59 mL/min (ref >60.00)
Glucose, Bld: 99 mg/dL (ref 70–99)
Potassium: 4 mEq/L (ref 3.5–5.1)
Sodium: 139 mEq/L (ref 135–145)
Total Bilirubin: 0.4 mg/dL (ref 0.2–1.2)
Total Protein: 6.9 g/dL (ref 6.0–8.3)

## 2022-12-31 LAB — T4, FREE: Free T4: 0.63 ng/dL (ref 0.60–1.60)

## 2022-12-31 LAB — TSH: TSH: 1.56 u[IU]/mL (ref 0.35–5.50)

## 2022-12-31 LAB — VITAMIN D 25 HYDROXY (VIT D DEFICIENCY, FRACTURES): VITD: 19.23 ng/mL — ABNORMAL LOW (ref 30.00–100.00)

## 2022-12-31 LAB — VITAMIN B12: Vitamin B-12: 258 pg/mL (ref 211–911)

## 2023-01-01 LAB — HEPATITIS C ANTIBODY: Hepatitis C Ab: NONREACTIVE

## 2023-01-07 ENCOUNTER — Ambulatory Visit (INDEPENDENT_AMBULATORY_CARE_PROVIDER_SITE_OTHER): Payer: Commercial Managed Care - PPO | Admitting: Family Medicine

## 2023-01-07 ENCOUNTER — Encounter: Payer: Self-pay | Admitting: Family Medicine

## 2023-01-07 VITALS — BP 124/80 | HR 73 | Temp 97.8°F | Ht 64.25 in | Wt 185.2 lb

## 2023-01-07 DIAGNOSIS — R519 Headache, unspecified: Secondary | ICD-10-CM

## 2023-01-07 DIAGNOSIS — E559 Vitamin D deficiency, unspecified: Secondary | ICD-10-CM

## 2023-01-07 DIAGNOSIS — E782 Mixed hyperlipidemia: Secondary | ICD-10-CM

## 2023-01-07 DIAGNOSIS — E669 Obesity, unspecified: Secondary | ICD-10-CM

## 2023-01-07 DIAGNOSIS — F418 Other specified anxiety disorders: Secondary | ICD-10-CM

## 2023-01-07 DIAGNOSIS — Z9189 Other specified personal risk factors, not elsewhere classified: Secondary | ICD-10-CM

## 2023-01-07 DIAGNOSIS — E039 Hypothyroidism, unspecified: Secondary | ICD-10-CM | POA: Diagnosis not present

## 2023-01-07 DIAGNOSIS — Z0000 Encounter for general adult medical examination without abnormal findings: Secondary | ICD-10-CM | POA: Diagnosis not present

## 2023-01-07 DIAGNOSIS — R42 Dizziness and giddiness: Secondary | ICD-10-CM

## 2023-01-07 DIAGNOSIS — Z96642 Presence of left artificial hip joint: Secondary | ICD-10-CM

## 2023-01-07 DIAGNOSIS — E538 Deficiency of other specified B group vitamins: Secondary | ICD-10-CM

## 2023-01-07 MED ORDER — DULOXETINE HCL 20 MG PO CPEP: 20.0000 mg | ORAL_CAPSULE | Freq: Every day | ORAL | 4 refills | Status: DC

## 2023-01-07 MED ORDER — CHOLECALCIFEROL 1.25 MG (50000 UT) PO TABS: 1.0000 | ORAL_TABLET | ORAL | 3 refills | Status: DC

## 2023-01-07 NOTE — Assessment & Plan Note (Signed)
Preventative protocols reviewed and updated unless pt declined. Discussed healthy diet and lifestyle.  

## 2023-01-07 NOTE — Assessment & Plan Note (Signed)
Chronic off meds. Reviewed diet choices to improve cholesterol levels. The 10-year ASCVD risk score (Arnett DK, et al., 2019) is: 2%   Values used to calculate the score:     Age: 52 years     Sex: Female     Is Non-Hispanic African American: No     Diabetic: No     Tobacco smoker: No     Systolic Blood Pressure: [id number] mmHg     Is BP treated: No     HDL Cholesterol: 47.6 mg/dL     Total Cholesterol: 230 mg/dL

## 2023-01-07 NOTE — Patient Instructions (Addendum)
Vision screen today  Sleep questionnaire today.  Start weekly vitamin D replacement. Restart monthly vitamin B12 shots.  Complete 2nd shingles shot when due.  Continue lower duloxetine 63m dose.  Let me know if ongoing headache or dizziness despite above.

## 2023-01-07 NOTE — Progress Notes (Signed)
Patient ID: Lindsey Proctor, female    DOB: 1970/12/30, 51 y.o.   MRN: [id number]  This visit was conducted in person.  BP 124/80   Pulse 73   Temp 97.8 F (36.6 C) (Temporal)   Ht 5' 4.25" (1.632 m)   Wt 185 lb 4 oz (84 kg)   SpO2 97%   BMI 31.55 kg/m   Vision Screening   Right eye Left eye Both eyes  Without correction     With correction 20/50 20/30 20/40 $    CC: CPE Subjective:   HPI: Lindsey Proctor is a 53 y.o. female presenting on 01/07/2023 for Annual Exam (Wants to discuss duloxetine dose. 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   Depression/anxiety - has been on cymbalta 34m daily for the past 8 months - then in Jan pharmacy filled 224m Overall feeling well on this dose.  Some increased stressors recently - son and his family are building new house, they moved into pt's house 11/01/2022. New promotion at work.   Recently over the past 1 month noticing increased dizziness - trees going by in car as passenger causes nausea, or she feels nauseated when scrolling through her phone. Frontal sinus HA when she wakes up in the mornings. No true vertigo.  No sinus pressure, ear pain, congestion.  [facility] with zpack, albuterol inhaler, prednisone.  S/p remote sinus surgery with recurrent nasal obstruction.   Non-restorative sleep. Snores. Morning headaches.  No daytime sleepiness. No PNdyspnea or witnessed apnea.   She's regularly been taking vit D 1000 IU daily.  Niece has previously provided home B12 shots.   Last eye exam 10/2022 - reassuring. She did recently start monovision contacts.    Preventative: COLONOSCOPY WITH PROPOFOL 11/02/2020 - [facility], rpt 10 yrs (WAllen NorrisDarren, MD) [facility] [place] [facility], last seen 11/2022 LMP - 08/2015.  [facility] recently [facility] for R dx mammo 11/2022 - [facility] Lung cancer  screen - not eligible  Flu shot yearly  COVID vaccine - declined Tdap 2012  [facility] Needs 2nd shot Seat belt use discussed.  Sunscreen use discussed. No changing moles on skin.  Sleep - averaging 6 hours/night Non smoker  Alcohol - none  Dentist q6 mo  Eye exam yearly   Caffeine: 2 20oz caffeinated drinks/day (diet mountain dew) Lives with husband, 2 children (son and daughter), 1 dog and cat Occupation: customer service call center Activity: aerobic exercise at home 3x/wk Diet: fruits and vegetables daily, lots of water, red meat 4 times a week, fish rarely, doing some intermittent fasting     Relevant past medical, surgical, family and social history reviewed and updated as indicated. Interim medical history since our last visit reviewed. Allergies and medications reviewed and updated. Outpatient Medications Prior to Visit  Medication Sig Dispense Refill   Cholecalciferol (VITAMIN D3) 25 MCG (1000 UT) CAPS Take 1 capsule (1,000 Units total) by mouth daily. 30 capsule    Cyanocobalamin (VITAMIN B-12) 5000 MCG TBDP Take 1 tablet by mouth daily.     DULoxetine 40 MG CPEP Take 40 mg by mouth daily. 90 capsule 2   montelukast (SINGULAIR) 10 MG tablet Take 1 tablet (10 mg total) by mouth at bedtime. 30 tablet 3   benzonatate (TESSALON) 100 MG capsule Take 1 capsule (100 mg total) by mouth 3 (  three) times daily as needed. 30 capsule 0   predniSONE (STERAPRED UNI-PAK 21 TAB) 10 MG (21) TBPK tablet 6 day taper; take as directed on package instructions 21 tablet 0   No facility-administered medications prior to visit.     Per HPI unless specifically indicated in ROS section below Review of Systems  Constitutional:  Positive for fever. Negative for activity change, appetite change, chills, fatigue and unexpected weight change.  HENT:  Negative for hearing loss.   Eyes:  Positive for visual disturbance (getting accustomed to monovision contacts).  Respiratory:   Positive for cough and wheezing. Negative for chest tightness and shortness of breath.   Cardiovascular:  Negative for chest pain, palpitations and leg swelling.  Gastrointestinal:  Positive for diarrhea (recent stomach flu), nausea and vomiting. Negative for abdominal distention, abdominal pain, blood in stool and constipation.       Recent stomach flu  Genitourinary:  Negative for difficulty urinating and hematuria.  Musculoskeletal:  Negative for arthralgias, myalgias and neck pain.  Skin:  Negative for rash.  Neurological:  Positive for dizziness and headaches. Negative for seizures and syncope.  Hematological:  Negative for adenopathy. Does not bruise/bleed easily.  Psychiatric/Behavioral:  Negative for dysphoric mood. The patient is not nervous/anxious.     Objective:  BP 124/80   Pulse 73   Temp 97.8 F (36.6 C) (Temporal)   Ht 5' 4.25" (1.632 m)   Wt 185 lb 4 oz (84 kg)   SpO2 97%   BMI 31.55 kg/m   Wt Readings from Last 3 Encounters:  01/07/23 185 lb 4 oz (84 kg)  12/17/21 175 lb 4 oz (79.5 kg)  01/09/21 189 lb 9 oz (86 kg)      Physical Exam Vitals and nursing note reviewed.  Constitutional:      Appearance: Normal appearance. She is not ill-appearing.  HENT:     Head: Normocephalic and atraumatic.     Right Ear: Tympanic membrane, ear canal and external ear normal. There is no impacted cerumen.     Left Ear: Tympanic membrane, ear canal and external ear normal. There is no impacted cerumen.     Mouth/Throat:     Comments: Wearing mask Eyes:     General:        Right eye: No discharge.        Left eye: No discharge.     Extraocular Movements: Extraocular movements intact.     Conjunctiva/sclera: Conjunctivae normal.     Pupils: Pupils are equal, round, and reactive to light.  Neck:     Thyroid: No thyroid mass or thyromegaly.  Cardiovascular:     Rate and Rhythm: Normal rate and regular rhythm.     Pulses: Normal pulses.     Heart sounds: Normal heart  sounds. No murmur heard. Pulmonary:     Effort: Pulmonary effort is normal. No respiratory distress.     Breath sounds: Normal breath sounds. No wheezing, rhonchi or rales.  Abdominal:     General: Bowel sounds are normal. There is no distension.     Palpations: Abdomen is soft. There is no mass.     Tenderness: There is no abdominal tenderness. There is no guarding or rebound.     Hernia: No hernia is present.  Musculoskeletal:     Cervical back: Normal range of motion and neck supple. No rigidity.     Right lower leg: No edema.     Left lower leg: No edema.  Lymphadenopathy:  Cervical: No cervical adenopathy.  Skin:    General: Skin is warm and dry.     Findings: No rash.  Neurological:     General: No focal deficit present.     Mental Status: She is alert. Mental status is at baseline.     Cranial Nerves: Cranial nerves 2-12 are intact.     Sensory: Sensation is intact.     Motor: Motor function is intact.     Coordination: Coordination is intact.     Gait: Gait is intact.     Comments:  CN 2-12 intact FTN intact EOMI Neg Dix [place]pike No pronator drift  Psychiatric:        Mood and Affect: Mood normal.        Behavior: Behavior normal.       Results for orders placed or performed in visit on 12/31/22  Hepatitis C antibody  Result Value Ref Range   Hepatitis C Ab NON-REACTIVE NON-REACTIVE  VITAMIN D 25 Hydroxy (Vit-D Deficiency, Fractures)  Result Value Ref Range   VITD 19.23 (L) 30.00 - 100.00 ng/mL  Vitamin B12  Result Value Ref Range   Vitamin B-12 258 211 - 911 pg/mL  T4, free  Result Value Ref Range   Free T4 0.63 0.60 - 1.60 ng/dL  TSH  Result Value Ref Range   TSH 1.56 0.35 - 5.50 uIU/mL  Comprehensive metabolic panel  Result Value Ref Range   Sodium 139 135 - 145 mEq/L   Potassium 4.0 3.5 - 5.1 mEq/L   Chloride 106 96 - 112 mEq/L   CO2 28 19 - 32 mEq/L   Glucose, Bld 99 70 - 99 mg/dL   BUN 18 6 - 23 mg/dL   Creatinine, Ser 0.73 0.40 - 1.20  mg/dL   Total Bilirubin 0.4 0.2 - 1.2 mg/dL   Alkaline Phosphatase 61 39 - 117 U/L   AST 16 0 - 37 U/L   ALT 15 0 - 35 U/L   Total Protein 6.9 6.0 - 8.3 g/dL   Albumin 4.2 3.5 - 5.2 g/dL   GFR 94.59 >60.00 mL/min   Calcium 9.3 8.4 - 10.5 mg/dL  Lipid panel  Result Value Ref Range   Cholesterol 230 (H) 0 - 200 mg/dL   Triglycerides 104.0 0.0 - 149.0 mg/dL   HDL 47.60 >39.00 mg/dL   VLDL 20.8 0.0 - 40.0 mg/dL   LDL Cholesterol 162 (H) 0 - 99 mg/dL   Total CHOL/HDL Ratio 5    NonHDL 182.41    Lab Results  Component Value Date   WBC 6.6 12/17/2021   HGB 12.6 12/17/2021   HCT 38.1 12/17/2021   MCV 86.9 12/17/2021   PLT 219.0 12/17/2021       01/07/2023    2:29 PM 12/17/2021   12:23 PM 01/09/2021   12:45 PM 09/03/2020    3:59 PM 09/03/2020    3:29 PM  Depression screen PHQ 2/9  Decreased Interest 0 0 0 1 0  Down, Depressed, Hopeless 0 0 0 1 0  PHQ - 2 Score 0 0 0 2 0  Altered sleeping 2 0 2 1   Tired, decreased energy 1 1 1 3   $ Change in appetite 1 0 1 1   Feeling bad or failure about yourself  0 0 1 2   Trouble concentrating 1 1 0 2   Moving slowly or fidgety/restless 0 0 0 1   Suicidal thoughts 0 0 0 0   PHQ-9 Score 5 2 5  12        01/07/2023    2:30 PM 12/17/2021   12:23 PM 01/09/2021   12:46 PM 09/03/2020    4:00 PM  GAD 7 : Generalized Anxiety Score  Nervous, Anxious, on Edge 1 0 1 2  Control/stop worrying 1 0 1 2  Worry too much - different things 1 0 1 2  Trouble relaxing 1 0 1 2  Restless 1 0 1 0  Easily annoyed or irritable 2 0 2 3  Afraid - awful might happen 0 0 1 0  Total GAD 7 Score 7 0 8 11   Assessment & Plan:   Problem List Items Addressed This Visit     Health maintenance examination - Primary (Chronic)    Preventative protocols reviewed and updated unless pt declined. Discussed healthy diet and lifestyle.       Obesity, Class I, BMI 30-34.9    Continue to encourage healthy diet and lifestyle choices for sustainable weight loss.        Relevant Orders   Ambulatory referral to Pulmonology   Hyperlipidemia    Chronic off meds. Reviewed diet choices to improve cholesterol levels. Given low ASCVD score, will remain off medication.  The 10-year ASCVD risk score (Arnett DK, et al., 2019) is: 2%   Values used to calculate the score:     Age: 39 years     Sex: Female     Is Non-Hispanic African American: No     Diabetic: No     Tobacco smoker: No     Systolic Blood Pressure: [id number] mmHg     Is BP treated: No     HDL Cholesterol: 47.6 mg/dL     Total Cholesterol: 230 mg/dL       Hypothyroidism    TSH, fT4 stable off medication - continue to monitor.       Vitamin B12 deficiency    Levels remain low normal despite 1053mg daily. Tested negative for pernicious anemia 2017 (IF Ab neg).  Will restart monthly B12 shots - her niece can do these at home.       Vitamin D deficiency    Levels remain low despite daily 2000 IU replacement - restart weekly Rx replacement .       Depression with anxiety    Overall stable period on lower cymbalta dose 214mdaily - continue this.  Her ultimate goal is to fully taper off antidepressant.       Relevant Medications   DULoxetine (CYMBALTA) 20 MG capsule   Status post left hip replacement   Morning headache    Notes morning headache as well as several other symptoms suggestive of sleep apnea. ESS = 16.  [place]  Nonfocal neurological exam. States recent eye exam was normal (10/2022) pointing against increased ICP.       Relevant Medications   DULoxetine (CYMBALTA) 20 MG capsule   Other Relevant Orders   Ambulatory referral to Pulmonology   Dizziness    Nonfocal neurological exam.  Describes motion sickness as well as cybersickness.  Neg [employer]      Other Visit Diagnoses     At risk for obstructive sleep apnea       Relevant Orders   Ambulatory referral to Pulmonology        Meds ordered this encounter   Medications   Cholecalciferol 1.25 MG (50000 UT) TABS    Sig: Take 1 tablet by mouth  once a week.    Dispense:  12 tablet    Refill:  3   DULoxetine (CYMBALTA) 20 MG capsule    Sig: Take 1 capsule (20 mg total) by mouth daily.    Dispense:  90 capsule    Refill:  4    Note new dose   cyanocobalamin (VITAMIN B12) 1000 MCG/ML injection    Sig: Inject 1 mL (1,000 mcg total) into the muscle every 30 (thirty) days.    Dispense:  1 mL    Refill:  11   montelukast (SINGULAIR) 10 MG tablet    Sig: Take 1 tablet (10 mg total) by mouth at bedtime.    Dispense:  30 tablet    Refill:  3    Orders Placed This Encounter  Procedures   Ambulatory referral to Pulmonology    Referral Priority:   Routine    Referral Type:   Consultation    Referral Reason:   Specialty Services Required    Requested Specialty:   Pulmonary Disease    Number of Visits Requested:   1    Patient Instructions  Vision screen today  Sleep questionnaire today.  Start weekly vitamin D replacement. Restart monthly vitamin B12 shots.  Complete 2nd shingles shot when due.  Continue lower duloxetine 65m dose.  Let me know if ongoing headache or dizziness despite above.   Follow up plan: Return if symptoms worsen or fail to improve.  JRia Bush MD

## 2023-01-08 ENCOUNTER — Telehealth: Payer: Self-pay | Admitting: Family Medicine

## 2023-01-08 DIAGNOSIS — R42 Dizziness and giddiness: Secondary | ICD-10-CM | POA: Insufficient documentation

## 2023-01-08 DIAGNOSIS — R519 Headache, unspecified: Secondary | ICD-10-CM | POA: Insufficient documentation

## 2023-01-08 MED ORDER — CYANOCOBALAMIN 1000 MCG/ML IJ SOLN: 1000.0000 ug | INTRAMUSCULAR | 11 refills | Status: DC

## 2023-01-08 MED ORDER — MONTELUKAST SODIUM 10 MG PO TABS: 10.0000 mg | ORAL_TABLET | Freq: Every day | ORAL | 3 refills | Status: DC

## 2023-01-08 NOTE — Assessment & Plan Note (Addendum)
Notes morning headache as well as several other symptoms suggestive of sleep apnea. ESS = 16.  Will refer to pulm in [place] for OSA evaluation  Nonfocal neurological exam. States recent eye exam was normal (10/2022) pointing against increased ICP.

## 2023-01-08 NOTE — Telephone Encounter (Signed)
At CPE yesterday I asked pt to restart home B12 shots - can we send in appropriate syringe/needles for med draw and injection? Thank you

## 2023-01-08 NOTE — Assessment & Plan Note (Signed)
Nonfocal neurological exam.  Describes motion sickness as well as cybersickness.  Neg [employer].

## 2023-01-08 NOTE — Assessment & Plan Note (Signed)
Levels remain low despite daily 2000 IU replacement - restart weekly Rx replacement .

## 2023-01-08 NOTE — Assessment & Plan Note (Addendum)
Levels remain low normal despite 1063mg daily. Tested negative for pernicious anemia 2017 (IF Ab neg).  Will restart monthly B12 shots - her niece can do these at home.

## 2023-01-08 NOTE — Assessment & Plan Note (Signed)
TSH, fT4 stable off medication - continue to monitor.

## 2023-01-08 NOTE — Assessment & Plan Note (Addendum)
Overall stable period on lower cymbalta dose 47m daily - continue this.  Her ultimate goal is to fully taper off antidepressant.

## 2023-01-08 NOTE — Assessment & Plan Note (Addendum)
Continue to encourage healthy diet and lifestyle choices for sustainable weight loss  

## 2023-01-09 MED ORDER — "SYRINGE/NEEDLE (DISP) 25G X 1"" 3 ML MISC": 0 refills | Status: DC

## 2023-01-09 NOTE — Telephone Encounter (Signed)
E-scribe rx for syringe/needle 25G X 1" 3 mL, #50/0 to [facility].

## 2023-01-09 NOTE — Addendum Note (Signed)
Addended by: Brenton Grills on: [phone] AM   Modules accepted: Orders

## 2023-04-12 ENCOUNTER — Telehealth: Payer: Commercial Managed Care - PPO | Admitting: Nurse Practitioner

## 2023-04-12 DIAGNOSIS — J029 Acute pharyngitis, unspecified: Secondary | ICD-10-CM | POA: Diagnosis not present

## 2023-04-12 NOTE — Progress Notes (Signed)
I have spent 5 minutes in review of e-visit questionnaire, review and updating patient chart, medical decision making and response to patient.  ° °Laqueta Bonaventura W Cydney Alvarenga, NP ° °  °

## 2023-04-12 NOTE — Progress Notes (Signed)
  E-Visit for Sore Throat  We are sorry that you are not feeling well.  Here is how we plan to help!  After careful review of your answers, I would not recommend an antibiotic for your condition.  Antibiotics should not be used to treat conditions that we suspect are caused by viruses like the virus that causes the common cold or flu. However, some people can have Strep with atypical symptoms. You may need formal testing in clinic or office to confirm if your symptoms continue or worsen.  Providers prescribe antibiotics to treat infections caused by bacteria. Antibiotics are very powerful in treating bacterial infections when they are used properly.  To maintain their effectiveness, they should be used only when necessary.  Overuse of antibiotics has resulted in the development of super bugs that are resistant to treatment!    I recommend warm salt water gargles, liquid motrin or tylenol for pain, tea with honey.   Your symptoms indicate a likely viral infection (Pharyngitis).   Pharyngitis is inflammation in the back of the throat which can cause a sore throat, scratchiness and sometimes difficulty swallowing.   Pharyngitis is typically caused by a respiratory virus and will just run its course.  Please keep in mind that your symptoms could last up to 10 days.  For throat pain, we recommend over the counter oral pain relief medications such as acetaminophen or aspirin, or anti-inflammatory medications such as ibuprofen or naproxen sodium.  Topical treatments such as oral throat lozenges or sprays may be used as needed.  Avoid close contact with loved ones, especially the very young and elderly.  Remember to wash your hands thoroughly throughout the day as this is the number one way to prevent the spread of infection and wipe down door knobs and counters with disinfectant.   Home Care: Only take medications as instructed by your medical team. Do not drink alcohol while taking these medications. A  steam or ultrasonic humidifier can help congestion.  You can place a towel over your head and breathe in the steam from hot water coming from a faucet. Avoid close contacts especially the very young and the elderly. Cover your mouth when you cough or sneeze. Always remember to wash your hands.  Get Help Right Away If: You develop worsening fever or throat pain. You develop a severe head ache or visual changes. Your symptoms persist after you have completed your treatment plan.  Make sure you Understand these instructions. Will watch your condition. Will get help right away if you are not doing well or get worse.   Thank you for choosing an e-visit.  Your e-visit answers were reviewed by a board certified advanced clinical practitioner to complete your personal care plan. Depending upon the condition, your plan could have included both over the counter or prescription medications.  Please review your pharmacy choice. Make sure the pharmacy is open so you can pick up prescription now. If there is a problem, you may contact your provider through [employer] and have the prescription routed to another pharmacy.  Your safety is important to [place]. If you have drug allergies check your prescription carefully.   For the next 24 hours you can use MyChart to ask questions about today's visit, request a non-urgent call back, or ask for a work or school excuse. You will get an email in the next two days asking about your experience. I hope that your e-visit has been valuable and will speed your recovery.

## 2023-06-30 ENCOUNTER — Encounter: Payer: Self-pay | Admitting: Family Medicine

## 2023-06-30 MED ORDER — DULOXETINE HCL 40 MG PO CPEP: 40.0000 mg | ORAL_CAPSULE | Freq: Every day | ORAL | 2 refills | Status: DC

## 2023-06-30 NOTE — Addendum Note (Signed)
Addended by: Eustaquio Boyden on: 06/30/2023 10:21 AM   Modules accepted: Orders

## 2023-12-10 ENCOUNTER — Other Ambulatory Visit: Payer: Self-pay | Admitting: Family Medicine

## 2023-12-10 NOTE — Telephone Encounter (Signed)
Refill request for  Vitamin D3 1.25 MG (50000 UT) Oral Capsule   LOV - 01/07/23 Next OV - 02/08/24 Last refill - 01/07/23 #12/3 Last lab was 19.23 on 12/31/22 Not future labs scheduled

## 2023-12-22 ENCOUNTER — Other Ambulatory Visit: Payer: Self-pay | Admitting: Family Medicine

## 2023-12-22 DIAGNOSIS — E538 Deficiency of other specified B group vitamins: Secondary | ICD-10-CM

## 2024-02-08 ENCOUNTER — Ambulatory Visit (INDEPENDENT_AMBULATORY_CARE_PROVIDER_SITE_OTHER): Payer: Commercial Managed Care - PPO | Admitting: Family Medicine

## 2024-02-08 VITALS — BP 134/84 | HR 68 | Temp 98.2°F | Ht 64.5 in | Wt 188.1 lb

## 2024-02-08 DIAGNOSIS — Z0000 Encounter for general adult medical examination without abnormal findings: Secondary | ICD-10-CM | POA: Diagnosis not present

## 2024-02-08 DIAGNOSIS — E559 Vitamin D deficiency, unspecified: Secondary | ICD-10-CM | POA: Diagnosis not present

## 2024-02-08 DIAGNOSIS — E538 Deficiency of other specified B group vitamins: Secondary | ICD-10-CM

## 2024-02-08 DIAGNOSIS — E782 Mixed hyperlipidemia: Secondary | ICD-10-CM

## 2024-02-08 DIAGNOSIS — R202 Paresthesia of skin: Secondary | ICD-10-CM | POA: Insufficient documentation

## 2024-02-08 DIAGNOSIS — E66811 Obesity, class 1: Secondary | ICD-10-CM

## 2024-02-08 DIAGNOSIS — F418 Other specified anxiety disorders: Secondary | ICD-10-CM

## 2024-02-08 DIAGNOSIS — E039 Hypothyroidism, unspecified: Secondary | ICD-10-CM

## 2024-02-08 DIAGNOSIS — Z780 Asymptomatic menopausal state: Secondary | ICD-10-CM

## 2024-02-08 LAB — CBC WITH DIFFERENTIAL/PLATELET
Basophils Absolute: 0 10*3/uL (ref 0.0–0.1)
Basophils Relative: 0.3 % (ref 0.0–3.0)
Eosinophils Absolute: 0.1 10*3/uL (ref 0.0–0.7)
Eosinophils Relative: 0.9 % (ref 0.0–5.0)
HCT: 38.8 % (ref 36.0–46.0)
Hemoglobin: 13 g/dL (ref 12.0–15.0)
Lymphocytes Relative: 31.4 % (ref 12.0–46.0)
Lymphs Abs: 1.9 10*3/uL (ref 0.7–4.0)
MCHC: 33.5 g/dL (ref 30.0–36.0)
MCV: 89.8 fl (ref 78.0–100.0)
Monocytes Absolute: 0.5 10*3/uL (ref 0.1–1.0)
Monocytes Relative: 8 % (ref 3.0–12.0)
Neutro Abs: 3.6 10*3/uL (ref 1.4–7.7)
Neutrophils Relative %: 59.4 % (ref 43.0–77.0)
Platelets: 214 10*3/uL (ref 150.0–400.0)
RBC: 4.32 Mil/uL (ref 3.87–5.11)
RDW: 13.4 % (ref 11.5–15.5)
WBC: 6.1 10*3/uL (ref 4.0–10.5)

## 2024-02-08 LAB — COMPREHENSIVE METABOLIC PANEL WITH GFR
ALT: 15 U/L (ref 0–35)
AST: 15 U/L (ref 0–37)
Albumin: 4.3 g/dL (ref 3.5–5.2)
Alkaline Phosphatase: 70 U/L (ref 39–117)
BUN: 23 mg/dL (ref 6–23)
CO2: 29 meq/L (ref 19–32)
Calcium: 9.2 mg/dL (ref 8.4–10.5)
Chloride: 107 meq/L (ref 96–112)
Creatinine, Ser: 0.68 mg/dL (ref 0.40–1.20)
GFR: 99.4 mL/min
Glucose, Bld: 93 mg/dL (ref 70–99)
Potassium: 4.1 meq/L (ref 3.5–5.1)
Sodium: 142 meq/L (ref 135–145)
Total Bilirubin: 0.3 mg/dL (ref 0.2–1.2)
Total Protein: 6.9 g/dL (ref 6.0–8.3)

## 2024-02-08 LAB — LIPID PANEL
Cholesterol: 234 mg/dL — ABNORMAL HIGH (ref 0–200)
HDL: 42.3 mg/dL (ref >39.00)
LDL Cholesterol: 156 mg/dL — ABNORMAL HIGH (ref 0–99)
NonHDL: 192.07
Total CHOL/HDL Ratio: 6
Triglycerides: 180 mg/dL — ABNORMAL HIGH (ref 0.0–149.0)
VLDL: 36 mg/dL (ref 0.0–40.0)

## 2024-02-08 LAB — TSH: TSH: 1.98 u[IU]/mL (ref 0.35–5.50)

## 2024-02-08 LAB — T4, FREE: Free T4: 0.7 ng/dL (ref 0.60–1.60)

## 2024-02-08 LAB — FOLATE: Folate: 18.5 ng/mL

## 2024-02-08 LAB — VITAMIN B12: Vitamin B-12: 451 pg/mL (ref 211–911)

## 2024-02-08 LAB — VITAMIN D 25 HYDROXY (VIT D DEFICIENCY, FRACTURES): VITD: 87.79 ng/mL (ref 30.00–100.00)

## 2024-02-08 MED ORDER — VITAMIN D3 1.25 MG (50000 UT) PO CAPS: 1.0000 | ORAL_CAPSULE | ORAL | 4 refills | Status: AC

## 2024-02-08 MED ORDER — "SYRINGE/NEEDLE (DISP) 25G X 1"" 3 ML MISC": 11 refills | Status: AC

## 2024-02-08 MED ORDER — DULOXETINE HCL 40 MG PO CPEP: 40.0000 mg | ORAL_CAPSULE | Freq: Every day | ORAL | 4 refills | Status: DC

## 2024-02-08 MED ORDER — CYANOCOBALAMIN 1000 MCG/ML IJ SOLN: 1000.0000 ug | INTRAMUSCULAR | 11 refills | Status: AC

## 2024-02-08 MED ORDER — MONTELUKAST SODIUM 10 MG PO TABS: 10.0000 mg | ORAL_TABLET | Freq: Every day | ORAL | 4 refills | Status: AC

## 2024-02-08 NOTE — Progress Notes (Signed)
 Ph: [phone] Fax: [phone]   Patient ID: Lindsey Proctor, female    DOB: 27-Feb-1971, 53 y.o.   MRN: [id number]  This visit was conducted in person.  BP 134/84   Pulse 68   Temp 98.2 F (36.8 C) (Oral)   Ht 5' 4.5" (1.638 m)   Wt 188 lb 2 oz (85.3 kg)   SpO2 95%   BMI 31.79 kg/m    CC: CPE Subjective:   HPI: Lindsey Proctor is a 53 y.o. female presenting on 02/08/2024 for Annual Exam (Pt provided wellness form to be signed. )   Caring for mother with Alz along with her siblings.   S/p hip replacement 01/15/2021 ([facility]).  Depression/anxiety - has been on cymbalta 40mg  daily for the past few years - doing well on current dose.  LMP 2016-02-27.  Father died 02/27/19 Shingles 2020 at that time as well  Cymbalta started at this time.  Wonders if mood issues some from post-menopause. She is hesitant to start HRT through GYN.    Notes some tingling/numbness to palms of left > right hands as well as itching. She's been using carpal tunnel sleeves with some relief.  She's regularly been taking vit D 50,000 IU weekly and B12 shots monthly.   Preventative: COLONOSCOPY WITH PROPOFOL 11/02/2020 - [facility], rpt 10 yrs Servando Snare, Darren, MD) [facility] - [facility] Dr Chestine Spore, Q2 yrs, upcoming appt 03/2024 LMP - 08/2015.  Mammogram 11/2022 - [facility])  Lung cancer screen - not eligible  Flu shot yearly  COVID vaccine - declined Tdap 02/27/2011, to consider updating  Shingrix - at [facility] 10/2022, got both Seat belt use discussed.  Sunscreen use discussed. No changing moles on skin.  Sleep - averaging 6 hours/night Non smoker  Alcohol - none  Dentist q6 mo  Eye exam yearly    Caffeine: 2 20oz caffeinated drinks/day (diet mountain dew) Lives with husband, 2 children (son and daughter), 1 dog and cat Occupation: customer service call center Activity: aerobic exercise at home 3x/wk Diet: fruits and vegetables daily, lots of water, red meat 4 times a week, fish  rarely, doing some intermittent fasting     Relevant past medical, surgical, family and social history reviewed and updated as indicated. Interim medical history since our last visit reviewed. Allergies and medications reviewed and updated. Outpatient Medications Prior to Visit  Medication Sig Dispense Refill   Cholecalciferol (VITAMIN D3) 1.25 MG (50000 UT) CAPS Take 1 capsule by mouth once a week 12 capsule 0   Cholecalciferol 1.25 MG (50000 UT) TABS Take 1 tablet by mouth once a week. 12 tablet 3   cyanocobalamin (VITAMIN B12) 1000 MCG/ML injection INJECT 1 ML INTO THE MUSCLE EVERY 30 DAYS 1 mL 1   DULoxetine 40 MG CPEP Take 1 capsule (40 mg total) by mouth daily. 90 capsule 2   montelukast (SINGULAIR) 10 MG tablet Take 1 tablet (10 mg total) by mouth at bedtime. 30 tablet 3   SYRINGE-NEEDLE, DISP, 3 ML 25G X 1" 3 ML MISC Use as instructed to inject vitamin B12 every 30 days 50 each 0   No facility-administered medications prior to visit.     Per HPI unless specifically indicated in ROS section below Review of Systems  Constitutional:  Negative for activity change, appetite change, chills, fatigue, fever and unexpected weight change.  HENT:  Negative for hearing loss.   Eyes:  Negative for visual disturbance.  Respiratory:  Negative for cough, chest tightness,  shortness of breath and wheezing.   Cardiovascular:  Negative for chest pain, palpitations and leg swelling.  Gastrointestinal:  Negative for abdominal distention, abdominal pain, blood in stool, constipation, diarrhea, nausea and vomiting.  Genitourinary:  Negative for difficulty urinating and hematuria.  Musculoskeletal:  Negative for arthralgias, myalgias and neck pain.  Skin:  Negative for rash.  Neurological:  Negative for dizziness, seizures, syncope and headaches.  Hematological:  Negative for adenopathy. Does not bruise/bleed easily.  Psychiatric/Behavioral:  Positive for dysphoric mood. The patient is nervous/anxious.         Intermittent mood swings    Objective:  BP 134/84   Pulse 68   Temp 98.2 F (36.8 C) (Oral)   Ht 5' 4.5" (1.638 m)   Wt 188 lb 2 oz (85.3 kg)   SpO2 95%   BMI 31.79 kg/m   Wt Readings from Last 3 Encounters:  02/08/24 188 lb 2 oz (85.3 kg)  01/07/23 185 lb 4 oz (84 kg)  12/17/21 175 lb 4 oz (79.5 kg)      Physical Exam Vitals and nursing note reviewed.  Constitutional:      Appearance: Normal appearance. She is not ill-appearing.  HENT:     Head: Normocephalic and atraumatic.     Right Ear: Tympanic membrane, ear canal and external ear normal. There is no impacted cerumen.     Left Ear: Tympanic membrane, ear canal and external ear normal. There is no impacted cerumen.     Mouth/Throat:     Mouth: Mucous membranes are moist.     Pharynx: Oropharynx is clear. No oropharyngeal exudate or posterior oropharyngeal erythema.  Eyes:     General:        Right eye: No discharge.        Left eye: No discharge.     Extraocular Movements: Extraocular movements intact.     Conjunctiva/sclera: Conjunctivae normal.     Pupils: Pupils are equal, round, and reactive to light.  Neck:     Thyroid: No thyroid mass or thyromegaly.  Cardiovascular:     Rate and Rhythm: Normal rate and regular rhythm.     Pulses: Normal pulses.     Heart sounds: Normal heart sounds. No murmur heard. Pulmonary:     Effort: Pulmonary effort is normal. No respiratory distress.     Breath sounds: Normal breath sounds. No wheezing, rhonchi or rales.  Abdominal:     General: Bowel sounds are normal. There is no distension.     Palpations: Abdomen is soft. There is no mass.     Tenderness: There is no abdominal tenderness. There is no guarding or rebound.     Hernia: No hernia is present.  Musculoskeletal:     Cervical back: Normal range of motion and neck supple. No rigidity.     Right lower leg: No edema.     Left lower leg: No edema.  Lymphadenopathy:     Cervical: No cervical adenopathy.   Skin:    General: Skin is warm and dry.     Findings: No rash.  Neurological:     General: No focal deficit present.     Mental Status: She is alert. Mental status is at baseline.     Comments:  Neg tinel and phalen's  Psychiatric:        Mood and Affect: Mood normal.        Behavior: Behavior normal.       Results for orders placed or performed in visit on  12/31/22  Hepatitis C antibody   Collection Time: 12/31/22  8:09 AM  Result Value Ref Range   Hepatitis C Ab NON-REACTIVE NON-REACTIVE  VITAMIN D 25 Hydroxy (Vit-D Deficiency, Fractures)   Collection Time: 12/31/22  8:09 AM  Result Value Ref Range   VITD 19.23 (L) 30.00 - 100.00 ng/mL  Vitamin B12   Collection Time: 12/31/22  8:09 AM  Result Value Ref Range   Vitamin B-12 258 211 - 911 pg/mL  T4, free   Collection Time: 12/31/22  8:09 AM  Result Value Ref Range   Free T4 0.63 0.60 - 1.60 ng/dL  TSH   Collection Time: 12/31/22  8:09 AM  Result Value Ref Range   TSH 1.56 0.35 - 5.50 uIU/mL  Comprehensive metabolic panel   Collection Time: 12/31/22  8:09 AM  Result Value Ref Range   Sodium 139 135 - 145 mEq/L   Potassium 4.0 3.5 - 5.1 mEq/L   Chloride 106 96 - 112 mEq/L   CO2 28 19 - 32 mEq/L   Glucose, Bld 99 70 - 99 mg/dL   BUN 18 6 - 23 mg/dL   Creatinine, Ser 1.61 0.40 - 1.20 mg/dL   Total Bilirubin 0.4 0.2 - 1.2 mg/dL   Alkaline Phosphatase 61 39 - 117 U/L   AST 16 0 - 37 U/L   ALT 15 0 - 35 U/L   Total Protein 6.9 6.0 - 8.3 g/dL   Albumin 4.2 3.5 - 5.2 g/dL   GFR 09.60 >45.40 mL/min   Calcium 9.3 8.4 - 10.5 mg/dL  Lipid panel   Collection Time: 12/31/22  8:09 AM  Result Value Ref Range   Cholesterol 230 (H) 0 - 200 mg/dL   Triglycerides 981.1 0.0 - 149.0 mg/dL   HDL 91.47 >82.95 mg/dL   VLDL 62.1 0.0 - 30.8 mg/dL   LDL Cholesterol 657 (H) 0 - 99 mg/dL   Total CHOL/HDL Ratio 5    NonHDL 182.41       02/08/2024   11:10 AM 01/07/2023    2:29 PM 12/17/2021   12:23 PM 01/09/2021   12:45 PM  09/03/2020    3:59 PM  Depression screen PHQ 2/9  Decreased Interest 0 0 0 0 1  Down, Depressed, Hopeless 0 0 0 0 1  PHQ - 2 Score 0 0 0 0 2  Altered sleeping 1 2 0 2 1  Tired, decreased energy 0 1 1 1 3   Change in appetite 0 1 0 1 1  Feeling bad or failure about yourself  0 0 0 1 2  Trouble concentrating 0 1 1 0 2  Moving slowly or fidgety/restless 0 0 0 0 1  Suicidal thoughts 0 0 0 0 0  PHQ-9 Score 1 5 2 5 12   Difficult doing work/chores Not difficult at all           02/08/2024   11:10 AM 01/07/2023    2:30 PM 12/17/2021   12:23 PM 01/09/2021   12:46 PM  GAD 7 : Generalized Anxiety Score  Nervous, Anxious, on Edge 1 1 0 1  Control/stop worrying 1 1 0 1  Worry too much - different things 1 1 0 1  Trouble relaxing 0 1 0 1  Restless 1 1 0 1  Easily annoyed or irritable 1 2 0 2  Afraid - awful might happen 0 0 0 1  Total GAD 7 Score 5 7 0 8  Anxiety Difficulty Somewhat difficult      Assessment &  Plan:   Problem List Items Addressed This Visit     Health maintenance examination - Primary (Chronic)   Preventative protocols reviewed and updated unless pt declined. Discussed healthy diet and lifestyle.       Obesity, Class I, BMI 30-34.9   Encouraged healthy diet and lifestyle choices      Hyperlipidemia   Chronic, stable of medication. Update FLP. Low ASCVD risk.  The 10-year ASCVD risk score (Arnett DK, et al., 2019) is: 2.5%   Values used to calculate the score:     Age: 44 years     Sex: Female     Is Non-Hispanic African American: No     Diabetic: No     Tobacco smoker: No     Systolic Blood Pressure: 134 mmHg     Is BP treated: No     HDL Cholesterol: 47.6 mg/dL     Total Cholesterol: 230 mg/dL       Relevant Orders   Lipid panel   Comprehensive metabolic panel   Hypothyroidism   H/o this - update TSH, fT4 off thyroid replacement.       Relevant Orders   TSH   T4, free   Vitamin B12 deficiency   Update levels on monthly B12 shots she administers  at home      Relevant Medications   cyanocobalamin (VITAMIN B12) 1000 MCG/ML injection   Other Relevant Orders   Vitamin B12   Vitamin D deficiency   Update levels on weekly Rx vit D3 replacement.       Relevant Orders   VITAMIN D 25 Hydroxy (Vit-D Deficiency, Fractures)   Postmenopausal   Depression with anxiety   Chronic, stable on cymbalta 40mg  daily. Attributes some symptoms to post-menopausal state.       Relevant Medications   DULoxetine HCl 40 MG CPEP   Hand paresthesia   L>R, with hand pruritus.  Phalen, tinel negative on exam today  Check vitamin levels.       Relevant Orders   CBC with Differential/Platelet   Vitamin B12   Folate     Meds ordered this encounter  Medications   Cholecalciferol (VITAMIN D3) 1.25 MG (50000 UT) CAPS    Sig: Take 1 capsule (1.25 mg total) by mouth once a week.    Dispense:  12 capsule    Refill:  4   cyanocobalamin (VITAMIN B12) 1000 MCG/ML injection    Sig: Inject 1 mL (1,000 mcg total) into the muscle every 30 (thirty) days.    Dispense:  1 mL    Refill:  11   DULoxetine HCl 40 MG CPEP    Sig: Take 1 capsule (40 mg total) by mouth daily.    Dispense:  90 capsule    Refill:  4   montelukast (SINGULAIR) 10 MG tablet    Sig: Take 1 tablet (10 mg total) by mouth at bedtime.    Dispense:  90 tablet    Refill:  4   SYRINGE-NEEDLE, DISP, 3 ML 25G X 1" 3 ML MISC    Sig: Use as instructed to inject vitamin B12 every 30 days    Dispense:  1 each    Refill:  11    Orders Placed This Encounter  Procedures   Lipid panel   Comprehensive metabolic panel   TSH   CBC with Differential/Platelet   Vitamin B12   T4, free   VITAMIN D 25 Hydroxy (Vit-D Deficiency, Fractures)   Folate    Patient Instructions  Check with pharmacy on dates of shingles shots and let me know. Labs today  Good to see you today Return as needed or in 1 year for next physical   Follow up plan: Return in about 1 year (around 02/07/2025) for annual  exam, prior fasting for blood work.  Eustaquio Boyden, MD

## 2024-02-08 NOTE — Assessment & Plan Note (Signed)
 Preventative protocols reviewed and updated unless pt declined. Discussed healthy diet and lifestyle.

## 2024-02-08 NOTE — Assessment & Plan Note (Signed)
 L>R, with hand pruritus.  Phalen, tinel negative on exam today  Check vitamin levels.

## 2024-02-08 NOTE — Assessment & Plan Note (Signed)
Encouraged healthy diet and lifestyle choices. 

## 2024-02-08 NOTE — Assessment & Plan Note (Signed)
 Update levels on monthly B12 shots she administers at home

## 2024-02-08 NOTE — Assessment & Plan Note (Signed)
 Update levels on weekly Rx vit D3 replacement.

## 2024-02-08 NOTE — Assessment & Plan Note (Signed)
 Chronic, stable on cymbalta 40mg  daily. Attributes some symptoms to post-menopausal state.

## 2024-02-08 NOTE — Assessment & Plan Note (Addendum)
 H/o this - update TSH, fT4 off thyroid replacement.

## 2024-02-08 NOTE — Patient Instructions (Addendum)
 Check with pharmacy on dates of shingles shots and let me know. Labs today  Good to see you today Return as needed or in 1 year for next physical

## 2024-02-08 NOTE — Assessment & Plan Note (Addendum)
 Chronic, stable of medication. Update FLP. Low ASCVD risk.  The 10-year ASCVD risk score (Arnett DK, et al., 2019) is: 2.5%   Values used to calculate the score:     Age: 53 years     Sex: Female     Is Non-Hispanic African American: No     Diabetic: No     Tobacco smoker: No     Systolic Blood Pressure: 134 mmHg     Is BP treated: No     HDL Cholesterol: 47.6 mg/dL     Total Cholesterol: 230 mg/dL

## 2024-02-09 ENCOUNTER — Encounter: Payer: Self-pay | Admitting: Family Medicine

## 2024-02-13 ENCOUNTER — Telehealth: Admitting: Physician Assistant

## 2024-02-13 DIAGNOSIS — J069 Acute upper respiratory infection, unspecified: Secondary | ICD-10-CM

## 2024-02-13 MED ORDER — BENZONATATE 100 MG PO CAPS: ORAL_CAPSULE | ORAL | 0 refills | Status: AC

## 2024-02-13 MED ORDER — FLUTICASONE PROPIONATE 50 MCG/ACT NA SUSP: 2.0000 | Freq: Every day | NASAL | 0 refills | Status: AC

## 2024-02-13 MED ORDER — PREDNISONE 20 MG PO TABS
ORAL_TABLET | ORAL | 0 refills | Status: AC
Start: 2024-02-13 — End: 2024-02-21

## 2024-02-13 NOTE — Addendum Note (Signed)
 Addended by: Roney Jaffe on: 02/13/2024 09:36 AM   Modules accepted: Orders

## 2024-02-13 NOTE — Progress Notes (Signed)

## 2024-04-20 ENCOUNTER — Encounter (INDEPENDENT_AMBULATORY_CARE_PROVIDER_SITE_OTHER): Payer: Self-pay | Admitting: Family Medicine

## 2024-04-20 DIAGNOSIS — F418 Other specified anxiety disorders: Secondary | ICD-10-CM

## 2024-04-21 ENCOUNTER — Encounter: Payer: Self-pay | Admitting: Family Medicine

## 2024-04-21 MED ORDER — DULOXETINE HCL 60 MG PO CPEP: 60.0000 mg | ORAL_CAPSULE | Freq: Every day | ORAL | 3 refills | Status: AC

## 2024-04-21 NOTE — Addendum Note (Signed)
 Addended by: Claire Crick on: 04/21/2024 01:55 PM   Modules accepted: Orders

## 2024-04-21 NOTE — Telephone Encounter (Signed)

## 2024-07-27 ENCOUNTER — Encounter: Payer: Self-pay | Admitting: Family Medicine

## 2024-08-10 ENCOUNTER — Ambulatory Visit: Admitting: Family Medicine

## 2024-08-10 ENCOUNTER — Encounter: Payer: Self-pay | Admitting: Family Medicine

## 2024-08-10 VITALS — BP 136/82 | HR 100 | Temp 98.1°F | Ht 64.5 in | Wt 192.5 lb

## 2024-08-10 DIAGNOSIS — R400 Somnolence: Secondary | ICD-10-CM | POA: Diagnosis not present

## 2024-08-10 DIAGNOSIS — F418 Other specified anxiety disorders: Secondary | ICD-10-CM | POA: Diagnosis not present

## 2024-08-10 DIAGNOSIS — E66811 Obesity, class 1: Secondary | ICD-10-CM | POA: Diagnosis not present

## 2024-08-10 DIAGNOSIS — R202 Paresthesia of skin: Secondary | ICD-10-CM

## 2024-08-10 DIAGNOSIS — Z6379 Other stressful life events affecting family and household: Secondary | ICD-10-CM | POA: Diagnosis not present

## 2024-08-10 DIAGNOSIS — G4733 Obstructive sleep apnea (adult) (pediatric): Secondary | ICD-10-CM | POA: Insufficient documentation

## 2024-08-10 NOTE — Assessment & Plan Note (Signed)
 Endorses loud snoring, non restorative sleep, daytime somnolence - takes naps during lunch at [employer] every day.  ESS = 4 Reviewed possible diagnosis of OSA as well as long term implications on health including heart and lungs, HTN, difficulty losing weight. Will order HST through [facility].

## 2024-08-10 NOTE — Assessment & Plan Note (Signed)
 Describes carpal tunnel symptoms.  Will further eval at future visit, consider wrist brace use overnight, consider NCS/EMG.

## 2024-08-10 NOTE — Assessment & Plan Note (Addendum)
 Patient is interested in bariatric medication. Reviewed options specifically, Contrave, Wegovy and Zepbound including mechanism of action of medication as well as side effects and adverse events to watch for including nausea, diarrhea, constipation, pancreatitis. No fmhx medullary thyroid  cancer or MEN2. Discussed titration schedule for medication. Reviewed importance of strength training to prevent muscle loss. Provided with names for pt to check on insurance coverage and then update me via mychart. Discussed need for regular visits for weight management to monitor medication effect and tolerance and weight loss, rec return 1 month after starting medication.   See below re OSA eval with HST.  Reviewed 24 hour dietary recall, recommendations provided.

## 2024-08-10 NOTE — Progress Notes (Signed)
 Ph: [phone] Fax: [phone]   Patient ID: Lindsey Proctor, female    DOB: 06/10/1971, 53 y.o.   MRN: [id number]  This visit was conducted in person.  BP 136/82   Pulse 100   Temp 98.1 F (36.7 C) (Oral)   Ht 5' 4.5 (1.638 m)   Wt 192 lb 8 oz (87.3 kg)   SpO2 95%   BMI 32.53 kg/m    CC: discuss weight loss medicine Subjective:   HPI: Lindsey Proctor is a 53 y.o. female presenting on 08/10/2024 for Weight Loss   Starting weight: 192 lbs Today's weight 192 lbs  Mom recently diagnosed with alzheimer's disease. We increased duloxetine  to 60mg  daily. No significant benefit noted with this.   Notes bilateral hand paresthesias/numbness with prolonged driving, ie holding steering wheel, or waking her up at night. 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  S/p L hip replacement 2022.   Overall healthy diet. She is cutting down on soft drinks and drinking more water .   Feels menopause symptoms contribute - hot flashes, mood swings.  She tried estrogen/progesterone through GYN - caused period.   Husband is on Ozempic with good effect.   Loud snorer with non-restorative sleep and daytime somnolence, but no witnessed apnea, PNdyspnea. Averaging 7 hours/night.  She does use breathe [employer] strip nightly for sleep She has h/o deviated septum s/p rhinoplasty 2000.   24 hour recall: 6:30am breakfast - chocolate chip cookies x2, diet mt dew  1:30pm lunch - tupperware of leftovers (rice, grilled chkcien, onions and green pepper), water   8:45 pm dinner - [employer] biggie bag with Jr bacon Cheesburger, 4 nuggets, small fries, Coke Zero.   Activity regimen: Limited by pain       Relevant past medical, surgical, family and social history reviewed and updated as indicated. Interim medical history since our last visit reviewed. Allergies and medications reviewed and updated. Outpatient Medications Prior to Visit  Medication Sig  Dispense Refill   Cholecalciferol  (VITAMIN D3) 1.25 MG (50000 UT) CAPS Take 1 capsule (1.25 mg total) by mouth once a week. 12 capsule 4   cyanocobalamin  (VITAMIN B12) 1000 MCG/ML injection Inject 1 mL (1,000 mcg total) into the muscle every 30 (thirty) days. 1 mL 11   DULoxetine  (CYMBALTA ) 60 MG capsule Take 1 capsule (60 mg total) by mouth daily. 90 capsule 3   fluticasone  (FLONASE ) 50 MCG/ACT nasal spray Place 2 sprays into both nostrils daily. 16 g 0   montelukast  (SINGULAIR ) 10 MG tablet Take 1 tablet (10 mg total) by mouth at bedtime. 90 tablet 4   SYRINGE-NEEDLE, DISP, 3 ML 25G X 1 3 ML MISC Use as instructed to inject vitamin B12 every 30 days 1 each 11   benzonatate  (TESSALON ) 100 MG capsule Take 1-2 caps PO TID PRN (Patient not taking: Reported on 08/10/2024) 20 capsule 0   No facility-administered medications prior to visit.     Per HPI unless specifically indicated in ROS section below Review of Systems  Objective:  BP 136/82   Pulse 100   Temp 98.1 F (36.7 C) (Oral)   Ht 5' 4.5 (1.638 m)   Wt 192 lb 8 oz (87.3 kg)   SpO2 95%   BMI 32.53 kg/m   Wt Readings from Last 3 Encounters:  08/10/24 192 lb 8 oz (87.3 kg)  02/08/24 188 lb 2 oz (85.3 kg)  01/07/23 185 lb 4 oz (84 kg)  Physical Exam Vitals and nursing note reviewed.  Constitutional:      Appearance: Normal appearance. She is not ill-appearing.  HENT:     Head: Normocephalic and atraumatic.     Mouth/Throat:     Mouth: Mucous membranes are moist.     Pharynx: Oropharynx is clear. No oropharyngeal exudate or posterior oropharyngeal erythema.  Eyes:     Extraocular Movements: Extraocular movements intact.     Pupils: Pupils are equal, round, and reactive to light.  Neck:     Thyroid : No thyroid  mass or thyromegaly.     Comments: Neck circ 36 cm Cardiovascular:     Rate and Rhythm: Normal rate and regular rhythm.     Pulses: Normal pulses.     Heart sounds: Normal heart sounds. No murmur  heard. Pulmonary:     Effort: Pulmonary effort is normal. No respiratory distress.     Breath sounds: Normal breath sounds. No wheezing, rhonchi or rales.  Abdominal:     General: Bowel sounds are normal. There is no distension.     Palpations: Abdomen is soft. There is no mass.     Tenderness: There is no abdominal tenderness. There is no guarding or rebound.     Hernia: No hernia is present.  Musculoskeletal:     Right lower leg: No edema.     Left lower leg: No edema.  Skin:    General: Skin is warm and dry.     Findings: No rash.  Neurological:     Mental Status: She is alert.  Psychiatric:        Mood and Affect: Mood normal.        Behavior: Behavior normal.       Results for orders placed or performed in visit on 02/08/24  Lipid panel   Collection Time: 02/08/24 11:30 AM  Result Value Ref Range   Cholesterol 234 (H) 0 - 200 mg/dL   Triglycerides 819.9 (H) 0.0 - 149.0 mg/dL   HDL 57.69 >60.99 mg/dL   VLDL 63.9 0.0 - 59.9 mg/dL   LDL Cholesterol 843 (H) 0 - 99 mg/dL   Total CHOL/HDL Ratio 6    NonHDL 192.07   Comprehensive metabolic panel   Collection Time: 02/08/24 11:30 AM  Result Value Ref Range   Sodium 142 135 - 145 mEq/L   Potassium 4.1 3.5 - 5.1 mEq/L   Chloride 107 96 - 112 mEq/L   CO2 29 19 - 32 mEq/L   Glucose, Bld 93 70 - 99 mg/dL   BUN 23 6 - 23 mg/dL   Creatinine, Ser 9.31 0.40 - 1.20 mg/dL   Total Bilirubin 0.3 0.2 - 1.2 mg/dL   Alkaline Phosphatase 70 39 - 117 U/L   AST 15 0 - 37 U/L   ALT 15 0 - 35 U/L   Total Protein 6.9 6.0 - 8.3 g/dL   Albumin 4.3 3.5 - 5.2 g/dL   GFR 00.59 >39.99 mL/min   Calcium 9.2 8.4 - 10.5 mg/dL  TSH   Collection Time: 02/08/24 11:30 AM  Result Value Ref Range   TSH 1.98 0.35 - 5.50 uIU/mL  CBC with Differential/Platelet   Collection Time: 02/08/24 11:30 AM  Result Value Ref Range   WBC 6.1 4.0 - 10.5 K/uL   RBC 4.32 3.87 - 5.11 Mil/uL   Hemoglobin 13.0 12.0 - 15.0 g/dL   HCT 61.1 63.9 - 53.9 %   MCV 89.8  78.0 - 100.0 fl   MCHC 33.5 30.0 - 36.0  g/dL   RDW 86.5 88.4 - 84.4 %   Platelets 214.0 150.0 - 400.0 K/uL   Neutrophils Relative % 59.4 43.0 - 77.0 %   Lymphocytes Relative 31.4 12.0 - 46.0 %   Monocytes Relative 8.0 3.0 - 12.0 %   Eosinophils Relative 0.9 0.0 - 5.0 %   Basophils Relative 0.3 0.0 - 3.0 %   Neutro Abs 3.6 1.4 - 7.7 K/uL   Lymphs Abs 1.9 0.7 - 4.0 K/uL   Monocytes Absolute 0.5 0.1 - 1.0 K/uL   Eosinophils Absolute 0.1 0.0 - 0.7 K/uL   Basophils Absolute 0.0 0.0 - 0.1 K/uL  Vitamin B12   Collection Time: 02/08/24 11:30 AM  Result Value Ref Range   Vitamin B-12 451 211 - 911 pg/mL  T4, free   Collection Time: 02/08/24 11:30 AM  Result Value Ref Range   Free T4 0.70 0.60 - 1.60 ng/dL  VITAMIN D  25 Hydroxy (Vit-D Deficiency, Fractures)   Collection Time: 02/08/24 11:30 AM  Result Value Ref Range   VITD 87.79 30.00 - 100.00 ng/mL  Folate   Collection Time: 02/08/24 11:30 AM  Result Value Ref Range   Folate 18.5 >5.9 ng/mL    Assessment & Plan:   Problem List Items Addressed This Visit     Obesity, Class I, BMI 30-34.9 - Primary   Patient is interested in bariatric medication. Reviewed options specifically, Contrave, Wegovy and Zepbound including mechanism of action of medication as well as side effects and adverse events to watch for including nausea, diarrhea, constipation, pancreatitis. No fmhx medullary thyroid  cancer or MEN2. Discussed titration schedule for medication. Reviewed importance of strength training to prevent muscle loss. Provided with names for pt to check on insurance coverage and then update me via mychart. Discussed need for regular visits for weight management to monitor medication effect and tolerance and weight loss, rec return 1 month after starting medication.   See below re OSA eval with HST.  Reviewed 24 hour dietary recall, recommendations provided.       Depression with anxiety   Chronic, ongoing difficulty despite increasing  duloxetine  to 60mg  daily.  Stress due to family illness contributes - mother's recent Alzheimer's diagnosis      Hand paresthesia   Describes carpal tunnel symptoms.  Will further eval at future visit, consider wrist brace use overnight, consider NCS/EMG.       Daytime somnolence   Endorses loud snoring, non restorative sleep, daytime somnolence - takes naps during lunch at [employer] every day.  ESS = 4 Reviewed possible diagnosis of OSA as well as long term implications on health including heart and lungs, HTN, difficulty losing weight. Will order HST through [facility].       Stress due to illness of family member     No orders of the defined types were placed in this encounter.   No orders of the defined types were placed in this encounter.   Patient Instructions  Sleepiness questionnaire positive screen - will order home sleep test.  Check on insurance coverage of weight loss mediciens: Zepbound, [id number] (weekly shots), Saxenda (daily shot), or Contrave (pill).  Let me know what you find out.  Good to see you today Return in 1-2 months for follow up visit if we start weight loss medicine.  [phone]  Follow up plan: Return if symptoms worsen or fail to improve.  Anton Blas, MD

## 2024-08-10 NOTE — Patient Instructions (Addendum)
 Sleepiness questionnaire positive screen - will order home sleep test.  Check on insurance coverage of weight loss mediciens: Zepbound, [id number] (weekly shots), Saxenda (daily shot), or Contrave (pill).  Let me know what you find out.  Good to see you today Return in 1-2 months for follow up visit if we start weight loss medicine.  [phone]

## 2024-08-10 NOTE — Assessment & Plan Note (Addendum)
 Chronic, ongoing difficulty despite increasing duloxetine  to 60mg  daily.  Stress due to family illness contributes - mother's recent Alzheimer's diagnosis

## 2024-08-15 ENCOUNTER — Telehealth: Payer: Self-pay

## 2024-09-10 ENCOUNTER — Ambulatory Visit: Payer: Self-pay | Admitting: Family Medicine

## 2024-09-10 DIAGNOSIS — G4733 Obstructive sleep apnea (adult) (pediatric): Secondary | ICD-10-CM

## 2024-09-14 ENCOUNTER — Telehealth: Payer: Self-pay

## 2024-09-14 ENCOUNTER — Other Ambulatory Visit (HOSPITAL_COMMUNITY): Payer: Self-pay

## 2024-09-14 MED ORDER — ZEPBOUND 2.5 MG/0.5ML ~~LOC~~ SOAJ: 2.5000 mg | SUBCUTANEOUS | 0 refills | Status: AC

## 2024-09-14 MED ORDER — ZEPBOUND 5 MG/0.5ML ~~LOC~~ SOAJ: 5.0000 mg | SUBCUTANEOUS | 1 refills | Status: AC

## 2024-09-14 NOTE — Telephone Encounter (Signed)
 Pharmacy Patient Advocate Encounter   Received notification from CoverMyMeds that prior authorization for Zepbound 2.5MG /0.5ML pen-injectors  is required/requested.   Insurance verification completed.   The patient is insured through [employer].   Per test claim: PA required; PA submitted to above mentioned insurance via Latent Key/confirmation #/EOC [id number] Status is pending  KEY [id number]

## 2024-09-15 ENCOUNTER — Other Ambulatory Visit (HOSPITAL_COMMUNITY): Payer: Self-pay

## 2024-09-16 NOTE — Telephone Encounter (Signed)
 PA for Zepbound was denied.

## 2024-09-16 NOTE — Telephone Encounter (Signed)
 Pharmacy Patient Advocate Encounter  Received notification from [employer] that Prior Authorization for Zepbound 2.5MG /0.5ML pen-injectors  has been DENIED.  Full denial letter will be uploaded to the media tab. See denial reason below.   PA #/Case ID/Reference #: [id number]   DENIAL REASON: PLAN EXCLUSION

## 2024-09-19 ENCOUNTER — Other Ambulatory Visit (HOSPITAL_COMMUNITY): Payer: Self-pay

## 2024-09-19 NOTE — Telephone Encounter (Signed)
 Please notify pt - Zepbound PA denied. This medication is not covered by her insurance at all.  If she'd like I could send to [facility] ($500 or less/month).

## 2024-09-20 ENCOUNTER — Other Ambulatory Visit (HOSPITAL_COMMUNITY): Payer: Self-pay

## 2025-02-10 ENCOUNTER — Encounter: Admitting: Family Medicine
